# Patient Record
Sex: Male | Born: 1975 | Race: White | Hispanic: No | Marital: Single | Smoking: Never smoker
Health system: Southern US, Community
[De-identification: ages and names within clinical notes are randomized; demographics above are authoritative.]

## PROBLEM LIST (undated history)

## (undated) DIAGNOSIS — N200 Calculus of kidney: Secondary | ICD-10-CM

## (undated) DIAGNOSIS — G809 Cerebral palsy, unspecified: Secondary | ICD-10-CM

## (undated) DIAGNOSIS — D649 Anemia, unspecified: Secondary | ICD-10-CM

## (undated) DIAGNOSIS — K264 Chronic or unspecified duodenal ulcer with hemorrhage: Secondary | ICD-10-CM

## (undated) DIAGNOSIS — K219 Gastro-esophageal reflux disease without esophagitis: Secondary | ICD-10-CM

## (undated) HISTORY — DX: Chronic or unspecified duodenal ulcer with hemorrhage: K26.4

## (undated) HISTORY — DX: Cerebral palsy, unspecified: G80.9

## (undated) HISTORY — DX: Anemia, unspecified: D64.9

---

## 2007-11-07 ENCOUNTER — Ambulatory Visit: Payer: Self-pay | Admitting: Internal Medicine

## 2007-11-21 ENCOUNTER — Emergency Department: Payer: Self-pay | Admitting: Emergency Medicine

## 2007-12-07 HISTORY — PX: COLONOSCOPY: SHX174

## 2008-01-08 ENCOUNTER — Ambulatory Visit: Payer: Self-pay | Admitting: Unknown Physician Specialty

## 2008-05-21 ENCOUNTER — Emergency Department: Payer: Self-pay | Admitting: Emergency Medicine

## 2009-05-09 IMAGING — CR DG KNEE 1-2V*L*
1 series · 2 of 2 positions shown · non-contrast
Comparison: none

REASON FOR EXAM: pain
COMMENTS:

PROCEDURE:     DXR - DXR KNEE LEFT AP AND LATERAL  - May 21, 2008  [DATE]
RESULT:     No fracture, dislocation or other acute bony abnormality is
identified. The knee joint space is well maintained. The patella is intact.

[Series 1: view not recorded · 0.17mm/px · 2 of 2 slices shown]
[im 1/2]
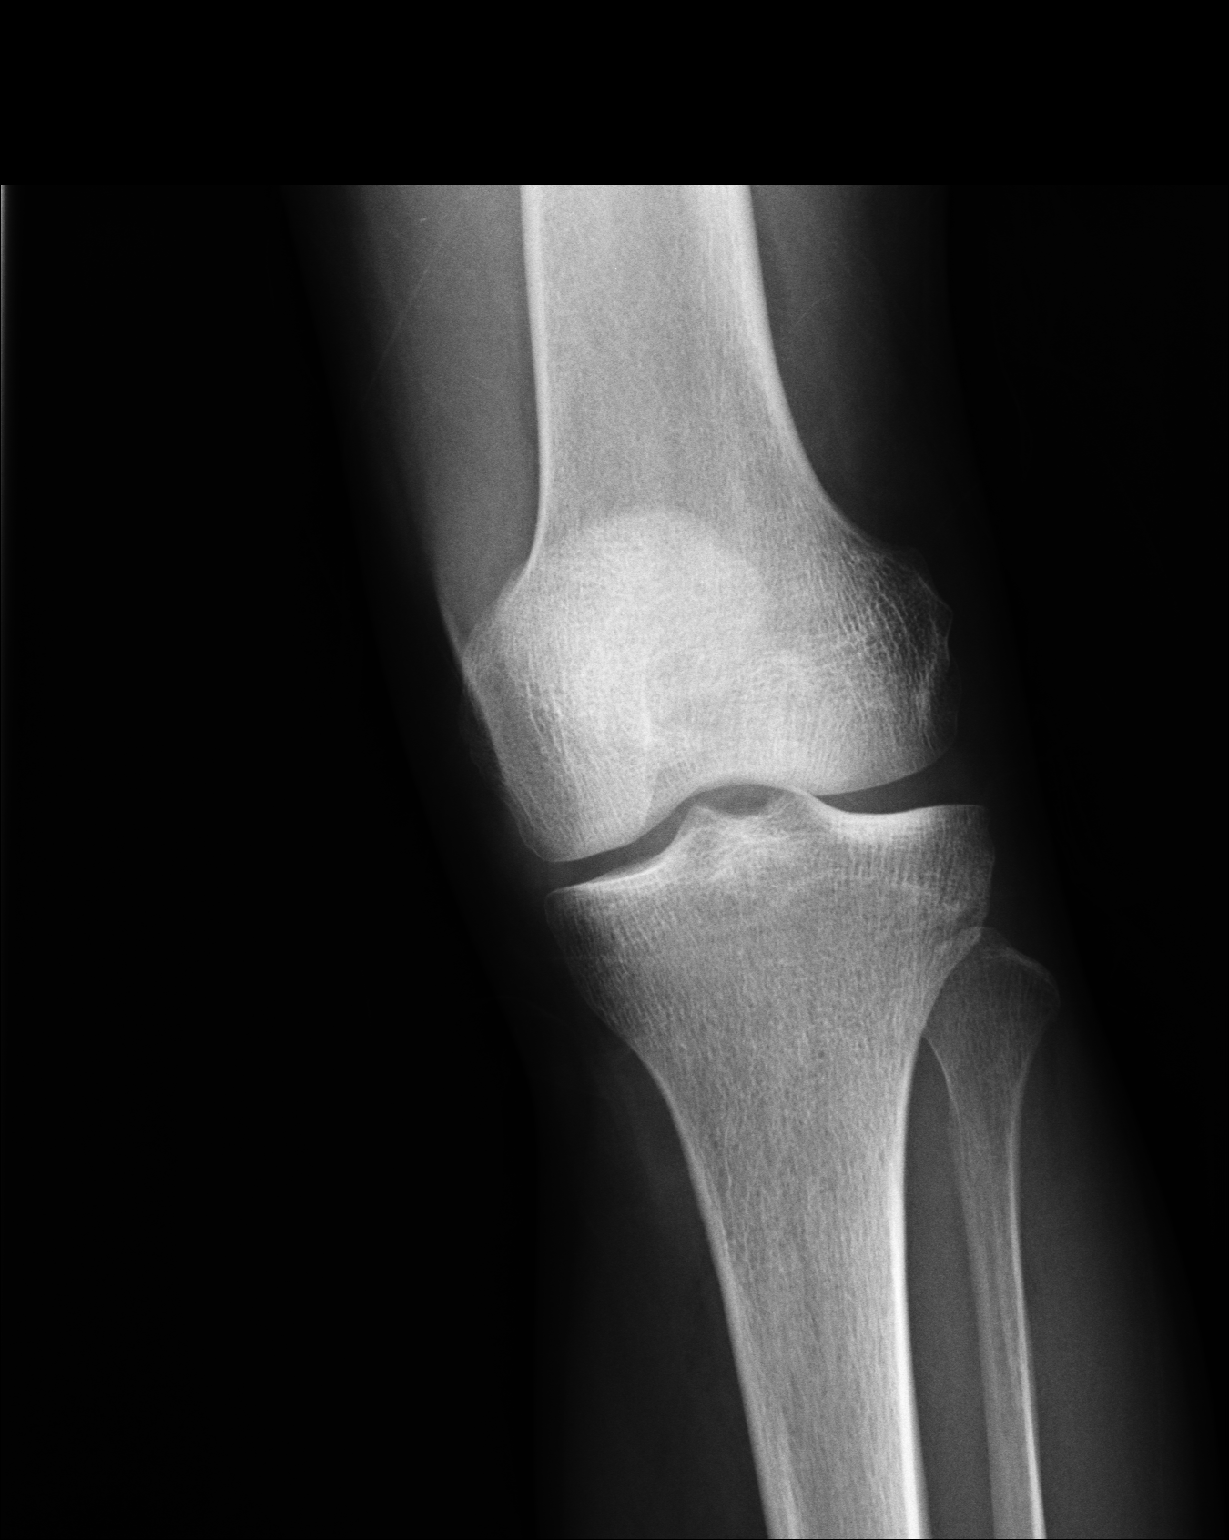
[im 2/2]
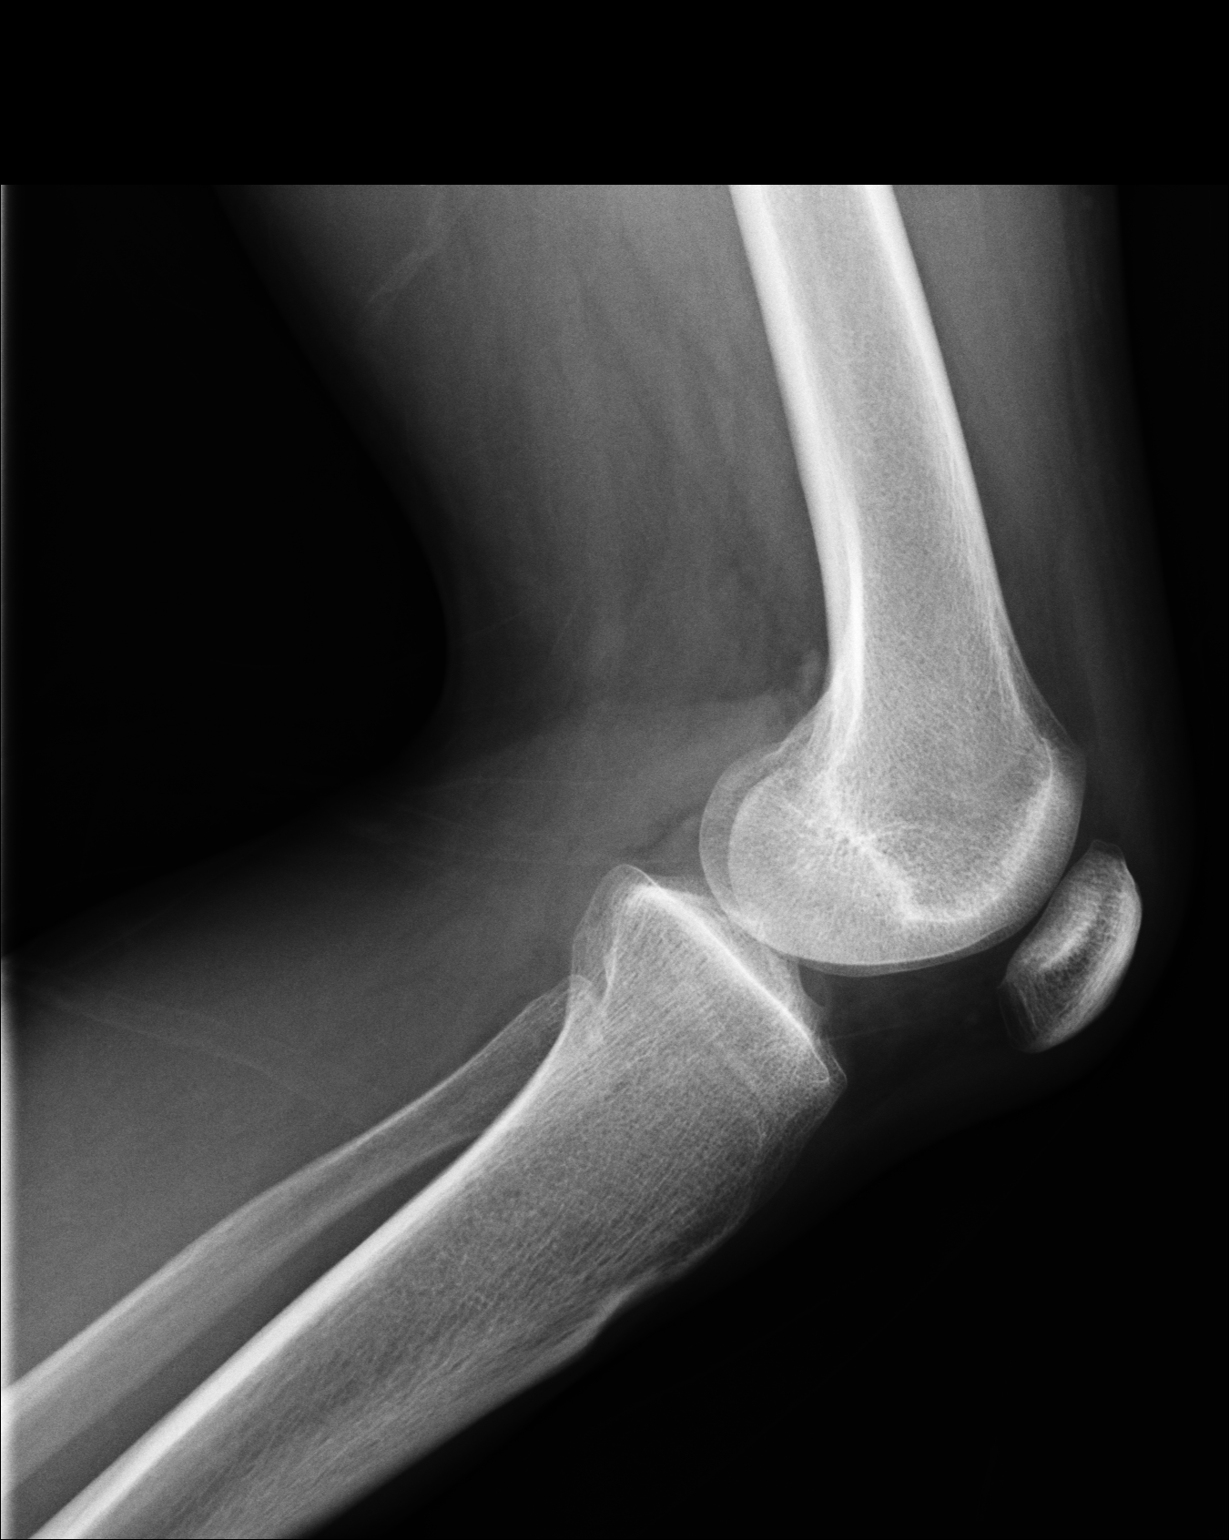

[2 of 2 positions shown; findings below may reference images not displayed]

IMPRESSION: 1.     No acute changes are identified.

## 2009-05-09 IMAGING — CR PELVIS - 1-2 VIEW
1 series · 1 of 1 positions shown · non-contrast
Comparison: none

REASON FOR EXAM: hip pain
COMMENTS:

PROCEDURE:     DXR - DXR PELVIS AP ONLY  - May 21, 2008  [DATE]
RESULT:     No fracture or other acute bony abnormality is identified. The
hip joint spaces are bilaterally symmetrical. The sacroiliac joints are
normal in appearance.

[view not recorded]
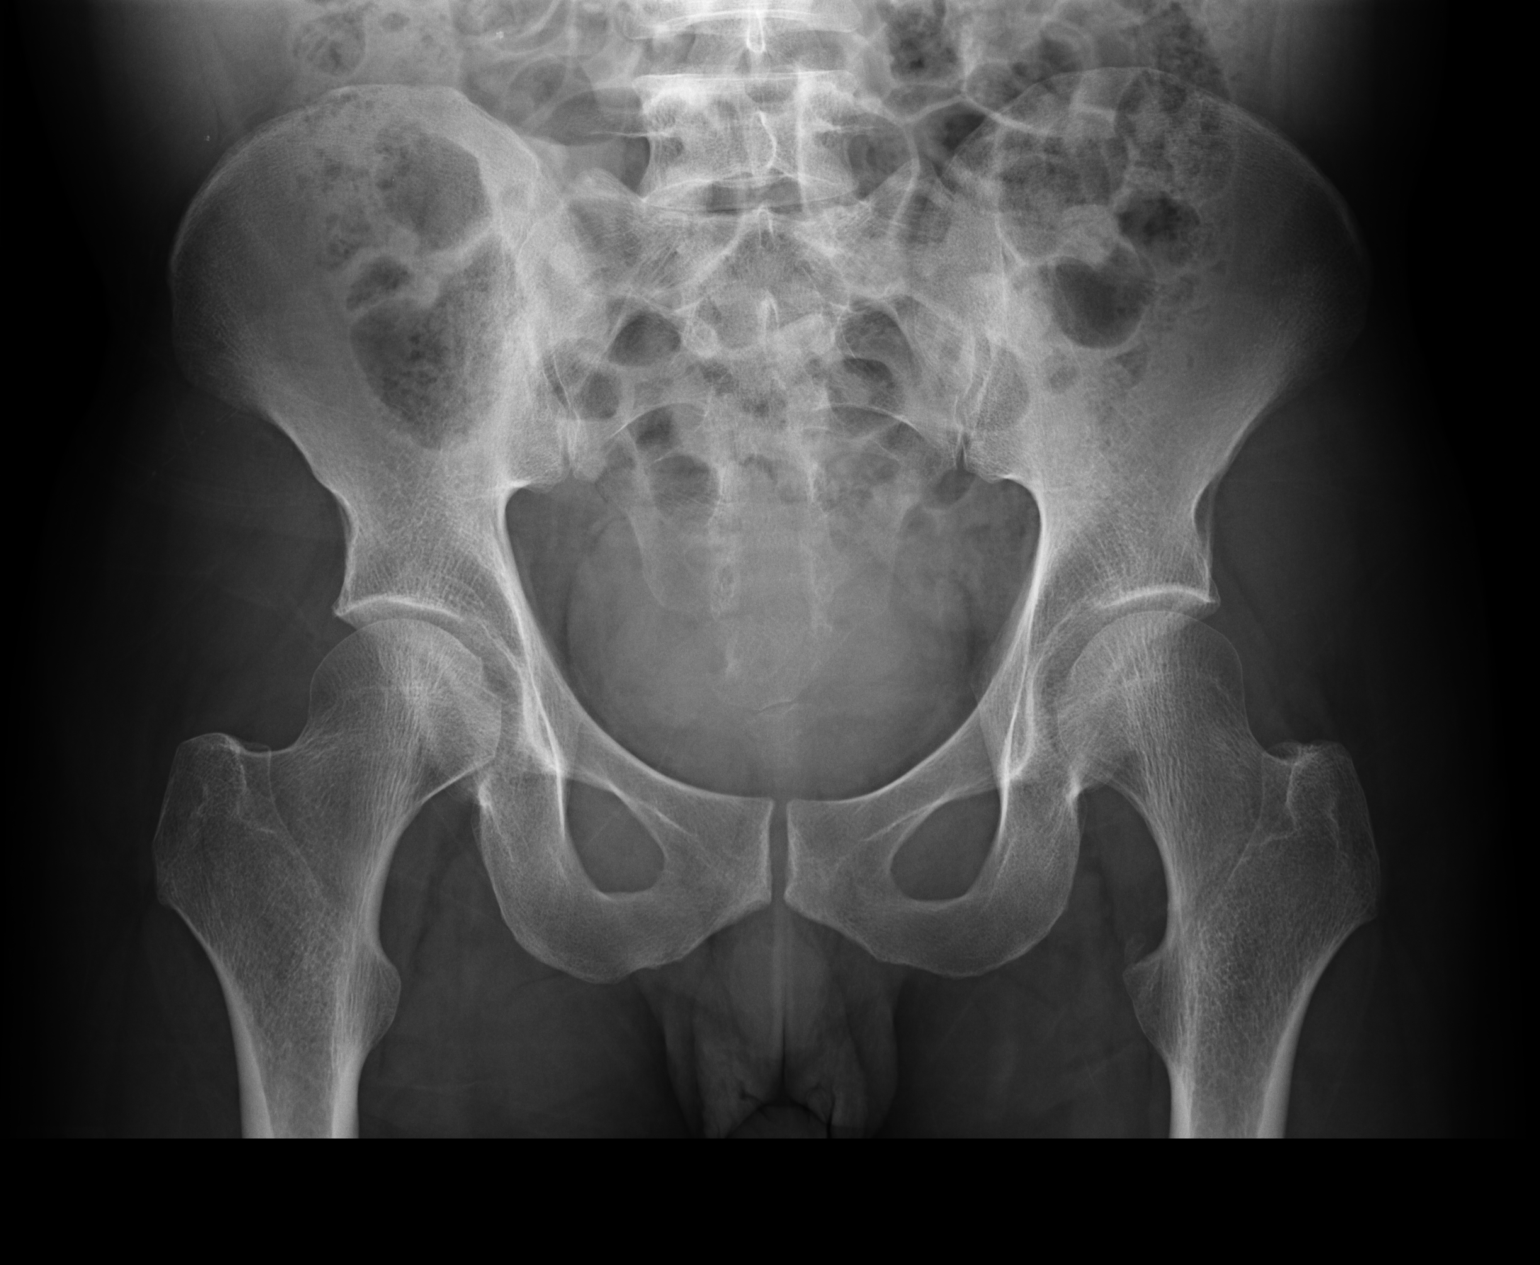

[1 of 1 positions shown; findings below may reference images not displayed]

IMPRESSION: 1.     No significant abnormalities are noted.

## 2009-12-06 HISTORY — PX: HIP FRACTURE SURGERY: SHX118

## 2010-08-13 ENCOUNTER — Inpatient Hospital Stay: Payer: Self-pay | Admitting: Specialist

## 2011-04-15 ENCOUNTER — Ambulatory Visit: Payer: Self-pay | Admitting: Unknown Physician Specialty

## 2011-12-07 HISTORY — PX: ESOPHAGOGASTRODUODENOSCOPY: SHX1529

## 2012-05-12 ENCOUNTER — Ambulatory Visit: Payer: Self-pay | Admitting: Unknown Physician Specialty

## 2012-05-16 LAB — PATHOLOGY REPORT

## 2012-07-19 ENCOUNTER — Ambulatory Visit: Payer: Self-pay | Admitting: Surgery

## 2012-08-15 ENCOUNTER — Ambulatory Visit: Payer: Self-pay | Admitting: Surgery

## 2012-08-15 LAB — COMPREHENSIVE METABOLIC PANEL
BUN: 14 mg/dL (ref 7–18)
Bilirubin,Total: 0.4 mg/dL (ref 0.2–1.0)
Calcium, Total: 9.2 mg/dL (ref 8.5–10.1)
Chloride: 107 mmol/L (ref 98–107)
Creatinine: 0.73 mg/dL (ref 0.60–1.30)
EGFR (African American): >60
Potassium: 3.8 mmol/L (ref 3.5–5.1)
SGPT (ALT): 23 U/L (ref 12–78)
Total Protein: 7.4 g/dL (ref 6.4–8.2)

## 2012-08-17 ENCOUNTER — Ambulatory Visit: Payer: Self-pay | Admitting: Surgery

## 2012-08-17 LAB — CBC WITH DIFFERENTIAL/PLATELET
Basophil #: 0 x10 3/mm 3
Basophil %: 0.9 %
Eosinophil #: 0.2 x10 3/mm 3
Eosinophil %: 4.3 %
HCT: 40 %
HGB: 13.9 g/dL
Lymphocyte %: 21.4 %
Lymphs Abs: 0.9 x10 3/mm 3 — ABNORMAL LOW
MCH: 30 pg
MCHC: 34.7 g/dL
MCV: 86 fL
Monocyte #: 0.4 "x10 3/mm "
Monocyte %: 8.3 %
Neutrophil #: 2.8 x10 3/mm 3
Neutrophil %: 65.1 %
Platelet: 196 x10 3/mm 3
RBC: 4.63 x10 6/mm 3
RDW: 14.5 %
WBC: 4.3 x10 3/mm 3

## 2012-08-22 ENCOUNTER — Ambulatory Visit: Payer: Self-pay | Admitting: Surgery

## 2012-08-23 LAB — PATHOLOGY REPORT

## 2013-07-07 IMAGING — NM NUCLEAR MEDICINE GASTRIC EMPTYING STUDY
6 series · 20 of 20 positions shown · non-contrast
Comparison: none

REASON FOR EXAM: reflux  hiatal hernia
COMMENTS:

[Series 1000: gastic statics 4 hour (results) · 3.90mm/px · 5 acquisitions, 10 frames shown]
[im 1/5]
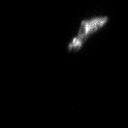
[im 1/5]
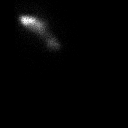
[im 2/5]
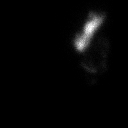
[im 2/5]
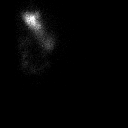
[im 3/5]
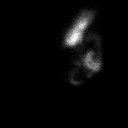
[im 3/5]
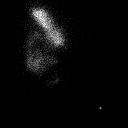
[im 4/5]
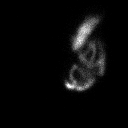
[im 4/5]
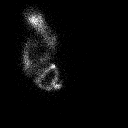
[im 5/5]
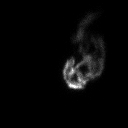
[im 5/5]
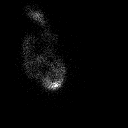

[Series 1000: gastric statics 2 hour · 3.90mm/px · 2 of 2 frames shown]
[frame 1/2]
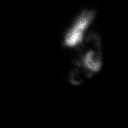
[frame 2/2]
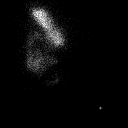

[Series 1000: gastric statics · 3.90mm/px · 2 of 2 frames shown]
[frame 1/2]
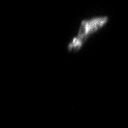
[frame 2/2]
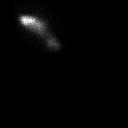

[Series 1000: gastric statics 1 hour · 3.90mm/px · 2 of 2 frames shown]
[frame 1/2]
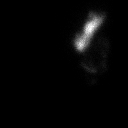
[frame 2/2]
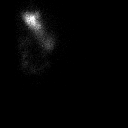

[Series 1000: gastic statics 4 hour · 3.90mm/px · 2 of 2 frames shown]
[frame 1/2]
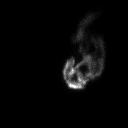
[frame 2/2]
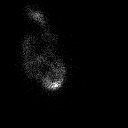

[Series 1000: gastric statics 3 hour · 3.90mm/px · 2 of 2 frames shown]
[frame 1/2]
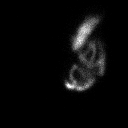
[frame 2/2]
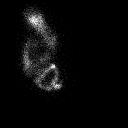

[20 of 20 positions shown; findings below may reference images not displayed]

PROCEDURE:     KNM - KNM GASTRIC EMPTYING STUDY  - July 19, 2012  [DATE]

RESULT:     The patient ingested the standard meal consisting of 4 ounces
water or ounces of egg whites in 2 pieces of toast with one package of
Locklear. Anterior and posterior images were obtained incrementally 4 total of
4 hours. Images demonstrate normal intragastric transit and excellent
emptying of activity from the stomach. At the end of 4 hours over 85%
appears to empty from the stomach.
IMPRESSION: Normal-appearing gastric emptying study.

[REDACTED]

## 2014-02-28 ENCOUNTER — Encounter: Payer: Self-pay | Admitting: Psychiatry

## 2014-03-06 ENCOUNTER — Encounter: Payer: Self-pay | Admitting: Psychiatry

## 2014-04-05 ENCOUNTER — Encounter: Payer: Self-pay | Admitting: Psychiatry

## 2014-06-11 ENCOUNTER — Ambulatory Visit: Payer: Self-pay | Admitting: Psychiatry

## 2014-09-03 DIAGNOSIS — G930 Cerebral cysts: Secondary | ICD-10-CM | POA: Insufficient documentation

## 2014-09-03 DIAGNOSIS — Z8719 Personal history of other diseases of the digestive system: Secondary | ICD-10-CM | POA: Insufficient documentation

## 2014-09-03 DIAGNOSIS — G809 Cerebral palsy, unspecified: Secondary | ICD-10-CM | POA: Insufficient documentation

## 2014-09-03 DIAGNOSIS — Z87442 Personal history of urinary calculi: Secondary | ICD-10-CM | POA: Insufficient documentation

## 2014-09-03 DIAGNOSIS — K219 Gastro-esophageal reflux disease without esophagitis: Secondary | ICD-10-CM | POA: Insufficient documentation

## 2015-02-10 DIAGNOSIS — R600 Localized edema: Secondary | ICD-10-CM | POA: Insufficient documentation

## 2015-03-25 NOTE — Op Note (Signed)
PATIENT NAME:  Alexander Sweeney, [name] MR#:  [id number] DATE OF BIRTH:  1976-05-28  DATE OF PROCEDURE:  08/22/2012  PREOPERATIVE DIAGNOSIS: Severe reflux.  POSTOPERATIVE DIAGNOSIS: Severe reflux.  OPERATION PERFORMED: Microlaryngoscopy with biopsy of larynx.   SURGEON: Davina Pokehapman T. Murphy Duzan, M.D.   OPERATIVE FINDINGS: Normal appearing glottis. He did have some significant erythema over the arytenoids and the postcricoid upper esophagus.   DESCRIPTION OF PROCEDURE: Worth was identified in the holding area and taken to the operating room and placed in the supine position. After general endotracheal anesthesia, the table was turned 90 degrees. A tooth guard was then placed. A Dedo laryngoscope was introduced into the airway. Examination of the larynx showed the vocal folds to be normal. The epiglottis and preepiglottic space appeared normal. The vallecula was normal. Examination of the piriforms did show significant erythema particularly in the postcricoid area and over both arytenoids. The laryngoscope was then positioned and suspended with the endotracheal tube anteriorly, giving  excellent visualization of the posterior aspect of the larynx. Cottonoid pledgets with phenylephrine and lidocaine were used to apply to the retinoid areas bilaterally. After five minutes these pledgets were removed. The small Shapshay microlaryngeal instruments were then brought onto the field. The straight cups were then used to take biopsies of the right arytenoid, the left arytenoid, and the postcricoid mucosa of the posterior hypopharyngeal wall. These biopsies were sent for permanent section. Cottonoid pledgets were then replaced over these areas again for hemostasis. After five minutes they were removed. Reexamination of the area showed no evidence of significant bleeding. The patient was then returned to anesthesia where Dr. Renda RollsWilton Smith was preparing for a Nissen fundoplication.   CULTURES: None.   SPECIMENS: Right and left  arytenoids, postcricoid.     ESTIMATED BLOOD LOSS: Less than 5 mL.   ____________________________ Davina Pokehapman T. Shamecka Hocutt, MD [id number] D: 08/22/2012 09:54:29 ET T: 08/22/2012 11:20:34 ET JOB#: [id number]  cc: Davina Pokehapman T. Candela Krul, MD, <Dictator> Davina PokeHAPMAN T Zyaira Vejar MD ELECTRONICALLY SIGNED 08/25/2012 7:47

## 2015-03-25 NOTE — Op Note (Signed)
PATIENT NAME:  Alexander Sweeney, Alexander Sweeney MR#:  [id number] DATE OF BIRTH:  01/13/1976  DATE OF PROCEDURE:  08/22/2012  PREOPERATIVE DIAGNOSIS: Chronic gastroesophageal reflux.   POSTOPERATIVE DIAGNOSIS: Chronic gastroesophageal reflux.   PROCEDURE: Laparoscopic Nissen fundoplication.  SURGEON: Adella HareJ. Wilton Smith, MD    ANESTHESIA: General.   INDICATIONS: This 39 year old male has a history of chronic gastroesophageal reflux, history of heartburn. He had laryngoscopy with findings of inflammation. He has been on treatment with Protonix but not making satisfactory progress with treatment. Manometry demonstrated some minimal diminution and esophageal body peristalsis. Gastric emptying was normal. Endoscopy was with evidence of reflux and surgery was recommended for definitive treatment.   DESCRIPTION OF PROCEDURE: The patient was placed on the operating table in the supine position under general endotracheal anesthesia. Dr. Jenne CampusMcQueen did laryngoscopy and biopsy and he will dictate a separate note.   After satisfactory completion of the laryngoscopy, the table was positioned for the laparoscopy. The abdomen was clipped and prepared with ChloraPrep and draped in a sterile manner.   The first incision was made just about 2 inches cephalad to the umbilicus some 12 mm in length, carried down through subcutaneous tissues to the deep fascia which was grasped with laryngeal hook and elevated. Veress needle was inserted, aspirated, and irrigated with a saline solution. Next, the peritoneal cavity was insufflated with carbon dioxide. The Veress needle was removed. The 10 mm cannula was inserted. The 10 mm 30 degree laparoscope was inserted to view the peritoneal cavity. The liver appeared normal. There was some gaseous distention of the stomach and I had the anesthetist insert an orogastric tube to decompress the stomach. Another incision was made just below the xiphoid process to insert an 11 mm cannula. Another incision was  made in the left upper quadrant at the costal margin at the anterior axillary line to insert an 11 mm cannula. Another incision was made at the right costal margin at the midclavicular line to insert an 11 mm cannula and another midway between that point and the camera site and a sixth port was placed in the right upper quadrant at the anterior axillary line for a total of six ports.   With the patient in the reverse Trendelenburg position, the fan retractor was introduced through the subxiphoid port to retract the left lobe of the liver anterior and to the right. This was held in place with the Bookwalter mechanical arm. A Babcock clamp was introduced to apply traction to the stomach. The diaphragmatic hiatus was identified. No hiatus hernia was seen as the stomach was within the abdomen. Next, the gastrohepatic ligament was incised with the Harmonic scalpel and this incision was carried over the anterior aspect of the esophagus incising the visceral peritoneum and dissection was carried down to expose the left crus of the diaphragm. Next, further dissection was carried out with Kitner's between the right crus of the diaphragm and the esophagus. There was no hiatus hernia found. Kitner's were used to dissect posterior to the esophagus to create a window dissecting from the patient's right to left. Babcock clamp was advanced from the right to left through the port in the anterior axillary line and the window posterior to the esophagus was triangulated and was approximately 3 cm. The left and right crus of the diaphragm were identified and seeing no hiatus hernia. Next, the fundus was found to be satisfactorily floppy and a portion of fundus was grasped with a Babcock clamp and carried posterior to the esophagus from left  to right. Another portion of the fundus, which was more anterior, was brought adjacent to it. The fundoplication was carried out with 0 Surgidac sutures placing one simple suture and two  figure-of-eight sutures with the first suture 2 cm from the last. Another suture was used to anchor the anterior aspect of the fundus to the right crus of the diaphragm to prevent traction on the esophagus. The fundoplication looked good. Hemostasis was intact. The laparoscopic instruments were removed. The wounds were infiltrated with 0.5% Sensorcaine with epinephrine. There was no significant bleeding from the port sites other than several small bleeding points in the subcutaneous tissues which were cauterized. The skin incisions were closed with interrupted 4-0 Monocryl sutures and Dermabond. The patient tolerated surgery satisfactorily and was then prepared for transfer to the recovery room.   ____________________________ Shela Commons. Renda Rolls, MD [email or web address] D: 08/22/2012 11:44:01 ET T: 08/22/2012 13:25:52 ET JOB#: [id number]  cc: Adella Hare, MD, <Dictator> Adella Hare MD ELECTRONICALLY SIGNED 08/24/2012 18:23

## 2017-02-01 DIAGNOSIS — D649 Anemia, unspecified: Secondary | ICD-10-CM | POA: Insufficient documentation

## 2020-03-26 DIAGNOSIS — I878 Other specified disorders of veins: Secondary | ICD-10-CM | POA: Insufficient documentation

## 2020-04-10 ENCOUNTER — Encounter (INDEPENDENT_AMBULATORY_CARE_PROVIDER_SITE_OTHER): Payer: Self-pay | Admitting: Vascular Surgery

## 2020-04-21 ENCOUNTER — Encounter (INDEPENDENT_AMBULATORY_CARE_PROVIDER_SITE_OTHER): Payer: Medicare PPO | Admitting: Vascular Surgery

## 2020-05-29 ENCOUNTER — Ambulatory Visit (INDEPENDENT_AMBULATORY_CARE_PROVIDER_SITE_OTHER): Payer: Medicare PPO | Admitting: Vascular Surgery

## 2020-05-29 ENCOUNTER — Other Ambulatory Visit: Payer: Self-pay

## 2020-05-29 ENCOUNTER — Encounter (INDEPENDENT_AMBULATORY_CARE_PROVIDER_SITE_OTHER): Payer: Self-pay | Admitting: Vascular Surgery

## 2020-05-29 VITALS — BP 124/83 | HR 91 | Resp 18 | Ht 71.0 in | Wt 220.0 lb

## 2020-05-29 DIAGNOSIS — R600 Localized edema: Secondary | ICD-10-CM

## 2020-05-29 DIAGNOSIS — I878 Other specified disorders of veins: Secondary | ICD-10-CM | POA: Diagnosis not present

## 2020-05-29 DIAGNOSIS — K219 Gastro-esophageal reflux disease without esophagitis: Secondary | ICD-10-CM

## 2020-05-29 NOTE — Progress Notes (Signed)
MRN : [id number]  Alexander Sweeney is a 44 y.o. (07/31/1976) male who presents with chief complaint of  Chief Complaint  Patient presents with  . New Patient (Initial Visit)    Venous stasis  .  History of Present Illness:   Patient is seen for evaluation of leg pain and leg swelling. The patient first noticed the swelling remotely. The swelling is associated with pain and discoloration. The pain and swelling worsens with prolonged dependency and improves with elevation. The pain is unrelated to activity.  The patient notes that in the morning the legs are significantly improved but they steadily worsened throughout the course of the day. The patient also notes a steady worsening of the discoloration in the ankle and shin area.   The patient denies claudication symptoms.  The patient denies symptoms consistent with rest pain.  The patient denies and extensive history of DJD and LS spine disease.  The patient has no had any past angiography, interventions or vascular surgery.  Elevation makes the leg symptoms better, dependency makes them much worse. There is no history of ulcerations. The patient denies any recent changes in medications.  The patient has not been wearing graduated compression.  The patient denies a history of DVT or PE. There is no prior history of phlebitis. There is no history of primary lymphedema.  No history of malignancies. No history of trauma or groin or pelvic surgery. There is no history of radiation treatment to the groin or pelvis  The patient denies amaurosis fugax or recent TIA symptoms. There are no recent neurological changes noted. The patient denies recent episodes of angina or shortness of breath  No outpatient medications have been marked as taking for the 05/29/20 encounter (Office Visit) with Gilda Crease, Latina Craver, MD.    No past medical history on file.    Social History Social History   Tobacco Use  . Smoking status: Never Smoker  .  Smokeless tobacco: Never Used  Substance Use Topics  . Alcohol use: Never  . Drug use: Never    Family History No family history on file.  No Known Allergies   REVIEW OF SYSTEMS (Negative unless checked)  Constitutional: [] Weight loss  [] Fever  [] Chills Cardiac: [] Chest pain   [] Chest pressure   [] Palpitations   [] Shortness of breath when laying flat   [] Shortness of breath with exertion. Vascular:  [] Pain in legs with walking   [x] Pain in legs at rest  [] History of DVT   [] Phlebitis   [x] Swelling in legs   [] Varicose veins   [] Non-healing ulcers Pulmonary:   [] Uses home oxygen   [] Productive cough   [] Hemoptysis   [] Wheeze  [] COPD   [] Asthma Neurologic:  [] Dizziness   [] Seizures   [] History of stroke   [] History of TIA  [] Aphasia   [] Vissual changes   [] Weakness or numbness in arm   [] Weakness or numbness in leg Musculoskeletal:   [] Joint swelling   [] Joint pain   [] Low back pain Hematologic:  [] Easy bruising  [] Easy bleeding   [] Hypercoagulable state   [] Anemic Gastrointestinal:  [] Diarrhea   [] Vomiting  [] Gastroesophageal reflux/heartburn   [] Difficulty swallowing. Genitourinary:  [] Chronic kidney disease   [] Difficult urination  [] Frequent urination   [] Blood in urine Skin:  [] Rashes   [] Ulcers  Psychological:  [] History of anxiety   []  History of major depression.  Physical Examination  Vitals:   05/29/20 1352  BP: 124/83  Pulse: 91  Resp: 18  Weight: 220 lb (99.8 kg)  Height: 5\' 11"  (1.803 m)   Body mass index is 30.68 kg/m. Gen: WD/WN, NAD Head: [place]/AT, No temporalis wasting.  Ear/Nose/Throat: Hearing grossly intact, nares w/o erythema or drainage Eyes: PER, EOMI, sclera nonicteric.  Neck: Supple, no large masses.   Pulmonary:  Good air movement, no audible wheezing bilaterally, no use of accessory muscles.  Cardiac: RRR, no JVD Vascular: scattered varicosities present bilaterally.  Mild venous stasis changes to the legs bilaterally.  3+ soft pitting edema Vessel  Right Left  Radial Palpable Palpable  PT Palpable Palpable  DP Palpable Palpable  Gastrointestinal: Non-distended. No guarding/no peritoneal signs.  Musculoskeletal: M/S 5/5 throughout.  No deformity or atrophy.  Neurologic: CN 2-12 intact. Symmetrical.  Speech is fluent. Motor exam as listed above. Psychiatric: Judgment intact, Mood & affect appropriate for pt's clinical situation. Dermatologic: No rashes or ulcers noted.  No changes consistent with cellulitis. Lymph : No lichenification or skin changes of chronic lymphedema.  CBC Lab Results  Component Value Date   WBC 4.3 08/17/2012   HGB 13.9 08/17/2012   HCT 40.0 08/17/2012   MCV 86 08/17/2012   PLT 183 08/24/2012    BMET    Component Value Date/Time   NA 141 08/15/2012 1352   K 3.8 08/15/2012 1352   CL 107 08/15/2012 1352   CO2 26 08/15/2012 1352   GLUCOSE 85 08/15/2012 1352   BUN 14 08/15/2012 1352   CREATININE 0.73 08/15/2012 1352   CALCIUM 9.2 08/15/2012 1352   GFRNONAA >60 08/15/2012 1352   GFRAA >60 08/15/2012 1352   CrCl cannot be calculated (Patient's most recent lab result is older than the maximum 21 days allowed.).  COAG No results found for: INR, PROTIME  Radiology No results found.   Assessment/Plan 1. Venous stasis No surgery or intervention at this point in time.  I have reviewed my discussion with the patient regarding venous insufficiency and why it causes symptoms. I have discussed with the patient the chronic skin changes that accompany venous insufficiency and the long term sequela such as ulceration. Patient will contnue wearing graduated compression stockings on a daily basis, as this has provided excellent control of his edema. The patient will put the stockings on first thing in the morning and removing them in the evening. The patient is reminded not to sleep in the stockings.  In addition, behavioral modification including elevation during the day will be initiated. Exercise is  strongly encouraged.  Given the patient's good control and lack of any problems regarding the venous insufficiency and lymphedema a lymph pump in not need at this time.  The patient will follow up with me PRN should anything change.  The patient voices agreement with this plan.   2. Pedal edema No surgery or intervention at this point in time.  I have reviewed my discussion with the patient regarding venous insufficiency and why it causes symptoms. I have discussed with the patient the chronic skin changes that accompany venous insufficiency and the long term sequela such as ulceration. Patient will contnue wearing graduated compression stockings on a daily basis, as this has provided excellent control of his edema. The patient will put the stockings on first thing in the morning and removing them in the evening. The patient is reminded not to sleep in the stockings.  In addition, behavioral modification including elevation during the day will be initiated. Exercise is strongly encouraged.  Given the patient's good control and lack of any problems regarding the venous insufficiency and lymphedema a  lymph pump in not need at this time.  The patient will follow up with me PRN should anything change.  The patient voices agreement with this plan.   3. Gastroesophageal reflux disease without esophagitis Continue PPI as already ordered, this medication has been reviewed and there are no changes at this time.  Avoidence of caffeine and alcohol  Moderate elevation of the head of the bed     Hortencia Pilar, MD  05/29/2020 1:56 PM

## 2020-06-09 ENCOUNTER — Encounter (INDEPENDENT_AMBULATORY_CARE_PROVIDER_SITE_OTHER): Payer: Self-pay | Admitting: Vascular Surgery

## 2023-03-08 ENCOUNTER — Encounter: Payer: Self-pay | Admitting: Internal Medicine

## 2023-03-09 ENCOUNTER — Ambulatory Visit: Payer: Medicare PPO | Admitting: Anesthesiology

## 2023-03-09 ENCOUNTER — Ambulatory Visit
Admission: RE | Admit: 2023-03-09 | Discharge: 2023-03-09 | Disposition: A | Discharge status: Home / Self Care | Payer: Medicare PPO | Attending: Internal Medicine | Admitting: Internal Medicine

## 2023-03-09 ENCOUNTER — Encounter
Admission: RE | Disposition: A | Discharge status: Home / Self Care | Payer: Self-pay | Source: Home / Self Care | Attending: Internal Medicine

## 2023-03-09 DIAGNOSIS — Z86010 Personal history of colonic polyps: Secondary | ICD-10-CM | POA: Insufficient documentation

## 2023-03-09 DIAGNOSIS — Z8711 Personal history of peptic ulcer disease: Secondary | ICD-10-CM | POA: Insufficient documentation

## 2023-03-09 DIAGNOSIS — K5730 Diverticulosis of large intestine without perforation or abscess without bleeding: Secondary | ICD-10-CM | POA: Insufficient documentation

## 2023-03-09 DIAGNOSIS — K640 First degree hemorrhoids: Secondary | ICD-10-CM | POA: Diagnosis not present

## 2023-03-09 DIAGNOSIS — Z87442 Personal history of urinary calculi: Secondary | ICD-10-CM | POA: Insufficient documentation

## 2023-03-09 DIAGNOSIS — K219 Gastro-esophageal reflux disease without esophagitis: Secondary | ICD-10-CM | POA: Insufficient documentation

## 2023-03-09 DIAGNOSIS — Z1211 Encounter for screening for malignant neoplasm of colon: Secondary | ICD-10-CM | POA: Diagnosis present

## 2023-03-09 DIAGNOSIS — G809 Cerebral palsy, unspecified: Secondary | ICD-10-CM | POA: Insufficient documentation

## 2023-03-09 HISTORY — DX: Gastro-esophageal reflux disease without esophagitis: K21.9

## 2023-03-09 HISTORY — DX: Calculus of kidney: N20.0

## 2023-03-09 HISTORY — PX: COLONOSCOPY WITH PROPOFOL: SHX5780

## 2023-03-09 SURGERY — COLONOSCOPY WITH PROPOFOL
Anesthesia: General

## 2023-03-09 MED ORDER — PROPOFOL 500 MG/50ML IV EMUL
INTRAVENOUS | Status: DC | PRN
  Administered 2023-03-09: 75 ug/kg/min via INTRAVENOUS

## 2023-03-09 MED ORDER — LIDOCAINE HCL (CARDIAC) PF 100 MG/5ML IV SOSY
PREFILLED_SYRINGE | INTRAVENOUS | Status: DC | PRN
  Administered 2023-03-09: 50 mg via INTRAVENOUS

## 2023-03-09 MED ORDER — LIDOCAINE HCL (PF) 2 % IJ SOLN
INTRAMUSCULAR | Status: AC
  Filled 2023-03-09: qty 5

## 2023-03-09 MED ORDER — PROPOFOL 10 MG/ML IV BOLUS
INTRAVENOUS | Status: AC
  Filled 2023-03-09: qty 40

## 2023-03-09 MED ORDER — SODIUM CHLORIDE 0.9 % IV SOLN
INTRAVENOUS | Status: DC
  Administered 2023-03-09: 20 mL/h via INTRAVENOUS

## 2023-03-09 MED ORDER — PROPOFOL 10 MG/ML IV BOLUS
INTRAVENOUS | Status: DC | PRN
  Administered 2023-03-09: 20 mg via INTRAVENOUS
  Administered 2023-03-09: 100 mg via INTRAVENOUS
  Administered 2023-03-09: 20 mg via INTRAVENOUS

## 2023-03-09 NOTE — Op Note (Signed)
[facility] Gastroenterology Patient Name: Alexander Sweeney Procedure Date: 03/09/2023 11:52 AM MRN: [id number] Account #: [id number] Date of Birth: 01-22-1976 Admit Type: Outpatient Age: 47 Room: [facility] ENDO ROOM 2 Gender: Male Note Status: Finalized Instrument Name: Jasper Riling [id number] Procedure:             Colonoscopy Indications:           Surveillance: Personal history of adenomatous polyps                         on last colonoscopy > 5 years ago Providers:             Lorie Apley K. Sharece Fleischhacker MD, MD Medicines:             Propofol per Anesthesia Complications:         No immediate complications. Procedure:             Pre-Anesthesia Assessment:                        - The risks and benefits of the procedure and the                         sedation options and risks were discussed with the                         patient. All questions were answered and informed                         consent was obtained.                        - Patient identification and proposed procedure were                         verified prior to the procedure by the nurse. The                         procedure was verified in the procedure room.                        - ASA Grade Assessment: III - A patient with severe                         systemic disease.                        - After reviewing the risks and benefits, the patient                         was deemed in satisfactory condition to undergo the                         procedure.                        After obtaining informed consent, the colonoscope was                         passed under direct vision. Throughout the procedure,  the patient's blood pressure, pulse, and oxygen                         saturations were monitored continuously. The                         Colonoscope was introduced through the anus and                         advanced to the the cecum, identified by appendiceal                          orifice and ileocecal valve. The colonoscopy was                         somewhat difficult due to a redundant colon and                         significant looping. Successful completion of the                         procedure was aided by applying abdominal pressure.                         The patient tolerated the procedure well. The quality                         of the bowel preparation was adequate. The ileocecal                         valve, appendiceal orifice, and rectum were                         photographed. Findings:      The perianal and digital rectal examinations were normal. Pertinent       negatives include normal sphincter tone and no palpable rectal lesions.      Non-bleeding internal hemorrhoids were found during retroflexion. The       hemorrhoids were Grade I (internal hemorrhoids that do not prolapse).      Multiple large-mouthed and small-mouthed diverticula were found in the       left colon. There was no evidence of diverticular bleeding.      The exam was otherwise without abnormality. Impression:            - Non-bleeding internal hemorrhoids.                        - Moderate diverticulosis in the left colon. There was                         no evidence of diverticular bleeding.                        - The examination was otherwise normal.                        - No specimens collected. Recommendation:        - Patient has a contact number available for  emergencies. The signs and symptoms of potential                         delayed complications were discussed with the patient.                         Return to normal activities tomorrow. Written                         discharge instructions were provided to the patient.                        - Resume previous diet.                        - Continue present medications.                        - Repeat colonoscopy in 10 years for screening                          purposes.                        - Return to GI office PRN.                        - The findings and recommendations were discussed with                         the patient. Procedure Code(s):     --- Professional ---                        [id number], Colorectal cancer screening; colonoscopy on                         individual at high risk Diagnosis Code(s):     --- Professional ---                        K57.30, Diverticulosis of large intestine without                         perforation or abscess without bleeding                        K64.0, First degree hemorrhoids                        Z86.010, Personal history of colonic polyps CPT copyright 2022 [employer]. All rights reserved. The codes documented in this report are preliminary and upon coder review may  be revised to meet current compliance requirements. Efrain Sella MD, MD 03/09/2023 12:34:38 PM This report has been signed electronically. Number of Addenda: 0 Note Initiated On: 03/09/2023 11:52 AM Scope Withdrawal Time: 0 hours 9 minutes 29 seconds  Total Procedure Duration: 0 hours 18 minutes 38 seconds  Estimated Blood Loss:  Estimated blood loss: none.      [facility]

## 2023-03-09 NOTE — Anesthesia Postprocedure Evaluation (Signed)
Anesthesia Post Note  Patient: Alexander Sweeney.  Procedure(s) Performed: COLONOSCOPY WITH PROPOFOL  Patient location during evaluation: Endoscopy Anesthesia Type: General Level of consciousness: awake and alert Pain management: pain level controlled Vital Signs Assessment: post-procedure vital signs reviewed and stable Respiratory status: spontaneous breathing, nonlabored ventilation, respiratory function stable and patient connected to nasal cannula oxygen Cardiovascular status: blood pressure returned to baseline and stable Postop Assessment: no apparent nausea or vomiting Anesthetic complications: no   No notable events documented.   Last Vitals:  Vitals:   03/09/23 1244 03/09/23 1254  BP: 129/80 125/81  Pulse: 89 88  Resp: 18 20  Temp:    SpO2: 98% 98%    Last Pain:  Vitals:   03/09/23 1254  TempSrc:   PainSc: 0-No pain                 Precious Haws Akaylah Lalley

## 2023-03-09 NOTE — Anesthesia Preprocedure Evaluation (Signed)
Anesthesia Evaluation  Patient identified by MRN, date of birth, ID band Patient awake    Reviewed: Allergy & Precautions, NPO status , Patient's Chart, lab work & pertinent test results  History of Anesthesia Complications Negative for: history of anesthetic complications  Airway Mallampati: III  TM Distance: >3 FB Neck ROM: full    Dental  (+) Chipped, Poor Dentition   Pulmonary neg pulmonary ROS, neg shortness of breath   Pulmonary exam normal        Cardiovascular Exercise Tolerance: Good negative cardio ROS Normal cardiovascular exam     Neuro/Psych  Neuromuscular disease  negative psych ROS   GI/Hepatic Neg liver ROS, PUD,GERD  Controlled,,  Endo/Other  negative endocrine ROS    Renal/GU Renal disease  negative genitourinary   Musculoskeletal   Abdominal   Peds  (+) Delivery details - Hematology negative hematology ROS (+)   Anesthesia Other Findings Past Medical History: No date: Anemia No date: Cerebral palsy No date: GERD (gastroesophageal reflux disease) No date: Renal stones 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  Past Surgical History: 2009: COLONOSCOPY; Bilateral 2013: ESOPHAGOGASTRODUODENOSCOPY 2011: HIP FRACTURE SURGERY; Left  BMI    Body Mass Index: 27.47 kg/m      Reproductive/Obstetrics negative OB ROS                             Anesthesia Physical Anesthesia Plan  ASA: 3  Anesthesia Plan: General   Post-op Pain Management:    Induction: Intravenous  PONV Risk Score and Plan: Propofol infusion and TIVA  Airway Management Planned: Natural Airway and Nasal Cannula  Additional Equipment:   Intra-op Plan:   Post-operative Plan:   Informed Consent: I have reviewed the patients History and Physical, chart, labs and discussed the procedure including the risks, benefits and alternatives for the proposed anesthesia with the patient or authorized  representative who has indicated his/her understanding and acceptance.     Dental Advisory Given  Plan Discussed with: Anesthesiologist, CRNA and Surgeon  Anesthesia Plan Comments: (Patient and mother consented for risks of anesthesia including but not limited to:  - adverse reactions to medications - risk of airway placement if required - damage to eyes, teeth, lips or other oral mucosa - nerve damage due to positioning  - sore throat or hoarseness - Damage to heart, brain, nerves, lungs, other parts of body or loss of life  They voiced understanding.)       Anesthesia Quick Evaluation

## 2023-03-09 NOTE — H&P (Signed)
Outpatient short stay form Pre-procedure 03/09/2023 11:01 AM Alexander Sweeney K. Alice Reichert, M.D.  Primary Physician: Harrel Lemon, M.D.  Reason for visit:  Personal history of colon polyps  History of present illness:   04/15/2011 Physician: Dr. Kennis Carina @ [facility] Polyps Removed: None, 5 yr rpt recommended d/t PHP and recall ltr mailed 2017 [place] - 11/06/2008.                           Patient presents for colonoscopy for a personal hx of colon polyps. The patient denies abdominal pain, abnormal weight loss or rectal bleeding.     No current facility-administered medications for this encounter. No current outpatient medications on file.  No medications prior to admission.     No Known Allergies   Past Medical History:  Diagnosis Date   Anemia    Cerebral palsy    GERD (gastroesophageal reflux disease)    Renal stones    Ulcer duodenal hemorrhage     Review of systems:  Otherwise negative.    Physical Exam  Gen: Alert, oriented. Appears stated age.  HEENT: [place]/AT. PERRLA. Lungs: CTA, no wheezes. CV: RR nl S1, S2. Abd: soft, benign, no masses. BS+ Ext: No edema. Pulses 2+    Planned procedures: Proceed with colonoscopy. The patient understands the nature of the planned procedure, indications, risks, alternatives and potential complications including but not limited to bleeding, infection, perforation, damage to internal organs and possible oversedation/side effects from anesthesia. The patient agrees and gives consent to proceed.  Please refer to procedure notes for findings, recommendations and patient disposition/instructions.     Alexander Sweeney K. Alice Reichert, M.D. Gastroenterology 03/09/2023  11:01 AM

## 2023-03-09 NOTE — Transfer of Care (Signed)
Immediate Anesthesia Transfer of Care Note  Patient: Alexander Sweeney.  Procedure(s) Performed: COLONOSCOPY WITH PROPOFOL  Patient Location: Endoscopy Unit  Anesthesia Type:General  Level of Consciousness: awake and alert   Airway & Oxygen Therapy: Patient Spontanous Breathing  Post-op Assessment: Report given to RN and Post -op Vital signs reviewed and stable  Post vital signs: Reviewed and stable  Last Vitals:  Vitals Value Taken Time  BP 109/65 03/09/23 1234  Temp 37 C 03/09/23 1234  Pulse 105 03/09/23 1234  Resp 20 03/09/23 1234  SpO2 99 % 03/09/23 1234    Last Pain:  Vitals:   03/09/23 1234  TempSrc: Temporal  PainSc: 0-No pain         Complications: No notable events documented.

## 2023-03-09 NOTE — Interval H&P Note (Signed)
History and Physical Interval Note:  03/09/2023 11:51 AM  Alexander Sweeney.  has presented today for surgery, with the diagnosis of history adenomatous polyps.  The various methods of treatment have been discussed with the patient and family. After consideration of risks, benefits and other options for treatment, the patient has consented to  Procedure(s): COLONOSCOPY WITH PROPOFOL (N/A) as a surgical intervention.  The patient's history has been reviewed, patient examined, no change in status, stable for surgery.  I have reviewed the patient's chart and labs.  Questions were answered to the patient's satisfaction.     [place], [place]

## 2023-03-10 ENCOUNTER — Encounter: Payer: Self-pay | Admitting: Internal Medicine

## 2023-04-05 ENCOUNTER — Other Ambulatory Visit (HOSPITAL_BASED_OUTPATIENT_CLINIC_OR_DEPARTMENT_OTHER): Payer: Self-pay

## 2023-04-05 DIAGNOSIS — R0683 Snoring: Secondary | ICD-10-CM

## 2023-04-05 DIAGNOSIS — R0681 Apnea, not elsewhere classified: Secondary | ICD-10-CM

## 2023-04-12 ENCOUNTER — Encounter: Payer: Self-pay | Admitting: Internal Medicine

## 2023-09-20 ENCOUNTER — Encounter: Payer: Medicare PPO | Attending: Physician Assistant | Admitting: Physician Assistant

## 2023-09-20 DIAGNOSIS — I87323 Chronic venous hypertension (idiopathic) with inflammation of bilateral lower extremity: Secondary | ICD-10-CM | POA: Diagnosis present

## 2023-09-20 DIAGNOSIS — I890 Lymphedema, not elsewhere classified: Secondary | ICD-10-CM | POA: Insufficient documentation

## 2023-09-20 DIAGNOSIS — I872 Venous insufficiency (chronic) (peripheral): Secondary | ICD-10-CM | POA: Insufficient documentation

## 2023-09-20 NOTE — Progress Notes (Signed)
RHYDER, KOEGEL ([id number]) [id number] Page 1 of 8 Visit Report for 09/20/2023 Allergy List Details Patient Name: Date of Service: Alexander Sweeney [place]. 09/20/2023 8:15 A M Medical Record Number: [id number] Patient Account Number: [id number] Date of Birth/Sex: Treating RN: August 02, 1976 (47 y.o. [name] Primary Care Sandhya Denherder: Marcelino Duster Other Clinician: Referring Yarenis Cerino: Treating Mahonri Seiden/Extender: Rosaland Lao in Treatment: 0 Allergies Active Allergies No Known Drug Allergies Allergy Notes Electronic Signature(s) Signed: 09/20/2023 4:19:17 PM By: Angelina Pih Entered By: Angelina Pih on 09/20/2023 05:41:28 -------------------------------------------------------------------------------- Arrival Information Details Patient Name: Date of Service: Alexander Lopes MA S W. 09/20/2023 8:15 A M Medical Record Number: [id number] Patient Account Number: [id number] Date of Birth/Sex: Treating RN: 11/29/1976 (47 y.o. [name] Primary Care Venancio Chenier: Marcelino Duster Other Clinician: Referring Loveda Colaizzi: Treating Lyndal Reggio/Extender: Rosaland Lao in Treatment: 0 Visit Information Patient Arrived: Ambulatory Arrival Time: 08:40 Accompanied By: family Transfer Assistance: None Patient Identification Verified: Yes Secondary Verification Process Completed: Yes Electronic Signature(s) Signed: 09/20/2023 4:19:17 PM By: Angelina Pih Entered By: Angelina Pih on 09/20/2023 05:41:02 Junius Creamer ([id number]) [id number] Page 2 of 8 -------------------------------------------------------------------------------- Clinic Level of Care Assessment Details Patient Name: Date of Service: Alexander Sweeney [place]. 09/20/2023 8:15 A M Medical Record Number: [id number] Patient Account Number: [id number] Date of Birth/Sex: Treating RN: 12/03/1976 (47 y.o. [name] Primary Care Jaimi Belle:  Marcelino Duster Other Clinician: Referring Zyren Sevigny: Treating Clarita Mcelvain/Extender: Rosaland Lao in Treatment: 0 Clinic Level of Care Assessment Items TOOL 2 Quantity Score []  - 0 Use when only an EandM is performed on the INITIAL visit ASSESSMENTS - Nursing Assessment / Reassessment X- 1 20 General Physical Exam (combine w/ comprehensive assessment (listed just below) when performed on new pt. evals) X- 1 25 Comprehensive Assessment (HX, ROS, Risk Assessments, Wounds Hx, etc.) ASSESSMENTS - Wound and Skin A ssessment / Reassessment []  - 0 Simple Wound Assessment / Reassessment - one wound []  - 0 Complex Wound Assessment / Reassessment - multiple wounds X- 1 10 Dermatologic / Skin Assessment (not related to wound area) ASSESSMENTS - Ostomy and/or Continence Assessment and Care []  - 0 Incontinence Assessment and Management []  - 0 Ostomy Care Assessment and Management (repouching, etc.) PROCESS - Coordination of Care X - Simple Patient / Family Education for ongoing care 1 15 []  - 0 Complex (extensive) Patient / Family Education for ongoing care X- 1 10 Staff obtains Chiropractor, Records, T Results / Process Orders est []  - 0 Staff telephones HHA, Nursing Homes / Clarify orders / etc []  - 0 Routine Transfer to another Facility (non-emergent condition) []  - 0 Routine Hospital Admission (non-emergent condition) X- 1 15 New Admissions / Manufacturing engineer / Ordering NPWT Apligraf, etc. , []  - 0 Emergency Hospital Admission (emergent condition) X- 1 10 Simple Discharge Coordination []  - 0 Complex (extensive) Discharge Coordination PROCESS - Special Needs []  - 0 Pediatric / Minor Patient Management []  - 0 Isolation Patient Management []  - 0 Hearing / Language / Visual special needs []  - 0 Assessment of Community assistance (transportation, D/C planning, etc.) []  - 0 Additional assistance / Altered mentation []  - 0 Support Surface(s) Assessment  (bed, cushion, seat, etc.) INTERVENTIONS - Wound Cleansing / Measurement X- 1 5 Wound Imaging (photographs - any number of wounds) []  - 0 Wound Tracing (instead of photographs) JHOVANI, GRISWOLD ([id number]) [id number] Page 3 of 8 []  - 0 Simple Wound Measurement - one wound []  -  0 Complex Wound Measurement - multiple wounds []  - 0 Simple Wound Cleansing - one wound []  - 0 Complex Wound Cleansing - multiple wounds INTERVENTIONS - Wound Dressings []  - 0 Small Wound Dressing one or multiple wounds []  - 0 Medium Wound Dressing one or multiple wounds []  - 0 Large Wound Dressing one or multiple wounds []  - 0 Application of Medications - injection INTERVENTIONS - Miscellaneous []  - 0 External ear exam []  - 0 Specimen Collection (cultures, biopsies, blood, body fluids, etc.) []  - 0 Specimen(s) / Culture(s) sent or taken to Lab for analysis []  - 0 Patient Transfer (multiple staff / Nurse, adult / Similar devices) []  - 0 Simple Staple / Suture removal (25 or less) []  - 0 Complex Staple / Suture removal (26 or more) []  - 0 Hypo / Hyperglycemic Management (close monitor of Blood Glucose) X- 1 15 Ankle / Brachial Index (ABI) - do not check if billed separately Has the patient been seen at the hospital within the last three years: Yes Total Score: 125 Level Of Care: New/Established - Level 4 Electronic Signature(s) Signed: 09/20/2023 4:19:17 PM By: Angelina Pih Entered By: Angelina Pih on 09/20/2023 09:23:17 -------------------------------------------------------------------------------- Encounter Discharge Information Details Patient Name: Date of Service: Alexander Lopes MA S W. 09/20/2023 8:15 A M Medical Record Number: [id number] Patient Account Number: [id number] Date of Birth/Sex: Treating RN: 1976/05/03 (47 y.o. [name] Primary Care Bryler Dibble: Marcelino Duster Other Clinician: Referring Toshiyuki Fredell: Treating Etheline Geppert/Extender: Rosaland Lao in Treatment: 0 Encounter Discharge Information Items Discharge Condition: Stable Ambulatory Status: Ambulatory Discharge Destination: Home Transportation: Private Auto Accompanied By: family Schedule Follow-up Appointment: Yes Clinical Summary of Care: Electronic Signature(s) Signed: 09/20/2023 12:25:11 PM By: Angelina Pih Entered By: Angelina Pih on 09/20/2023 09:25:11 Junius Creamer ([id number]) [id number] Page 4 of 8 -------------------------------------------------------------------------------- Lower Extremity Assessment Details Patient Name: Date of Service: Alexander Sweeney [place]. 09/20/2023 8:15 A M Medical Record Number: [id number] Patient Account Number: [id number] Date of Birth/Sex: Treating RN: 1976-04-14 (47 y.o. [name] Primary Care Chayce Robbins: Marcelino Duster Other Clinician: Referring Indi Willhite: Treating Nikhil Osei/Extender: Rosaland Lao in Treatment: 0 Edema Assessment Assessed: [Left: No] [Right: No] [Left: Edema] [Right: :] Calf Left: Right: Point of Measurement: 34 cm From Medial Instep 37.5 cm 37.3 cm Ankle Left: Right: Point of Measurement: 11 cm From Medial Instep 23 cm 21.5 cm Vascular Assessment Pulses: Dorsalis Pedis Palpable: [Left:Yes] [Right:Yes] Posterior Tibial Palpable: [Left:Yes] [Right:Yes] Extremity colors, hair growth, and conditions: Extremity Color: [Left:Hyperpigmented] [Right:Hyperpigmented] Hair Growth on Extremity: [Left:Yes] [Right:Yes] Temperature of Extremity: [Left:Cool] [Right:Cool] Capillary Refill: [Left:< 3 seconds] [Right:< 3 seconds] Blood Pressure: Brachial: [Left:144] [Right:144] Ankle: [Left:Dorsalis Pedis: 130 0.90] [Right:Dorsalis Pedis: 130 0.90] Toe Nail Assessment Left: Right: Thick: No No Discolored: No No Deformed: No No Improper Length and Hygiene: No No Electronic Signature(s) Signed: 09/20/2023 4:19:17 PM By: Angelina Pih Entered By: Angelina Pih on 09/20/2023 06:01:45 Junius Creamer ([id number]) [id number] Page 5 of 8 -------------------------------------------------------------------------------- Multi Wound Chart Details Patient Name: Date of Service: Alexander Sweeney [place]. 09/20/2023 8:15 A M Medical Record Number: [id number] Patient Account Number: [id number] Date of Birth/Sex: Treating RN: 11-02-1976 (47 y.o. [name] Primary Care Merinda Victorino: Marcelino Duster Other Clinician: Referring Carylon Tamburro: Treating Jafet Wissing/Extender: Rosaland Lao in Treatment: 0 Vital Signs Height(in): 73 Pulse(bpm): 78 Weight(lbs): 230 Blood Pressure(mmHg): 144/90 Body Mass Index(BMI): 30.3 Temperature(F): 97.5 Respiratory Rate(breaths/min): 18 [Treatment Notes:Wound Assessments Treatment Notes] Electronic Signature(s) Signed:  09/20/2023 12:21:17 PM By: Angelina Pih Entered By: Angelina Pih on 09/20/2023 09:21:17 -------------------------------------------------------------------------------- Multi-Disciplinary Care Plan Details Patient Name: Date of Service: Alexander Sweeney [place] W. 09/20/2023 8:15 A M Medical Record Number: [id number] Patient Account Number: [id number] Date of Birth/Sex: Treating RN: 10-Aug-1976 (47 y.o. [name] Primary Care Jacen Carlini: Marcelino Duster Other Clinician: Referring Donevan Biller: Treating Lyric Rossano/Extender: Rosaland Lao in Treatment: 0 Active Inactive Orientation to the Wound Care Program Nursing Diagnoses: Knowledge deficit related to the wound healing center program Goals: Patient/caregiver will verbalize understanding of the Wound Healing Center Program Date Initiated: 09/20/2023 Target Resolution Date: 10/18/2023 Goal Status: Active Interventions: Provide education on orientation to the wound center [place] ([id number]) [phone].pdf Page 6 of 8 Notes: Electronic  Signature(s) Signed: 09/20/2023 12:24:15 PM By: Angelina Pih Entered By: Angelina Pih on 09/20/2023 09:24:15 -------------------------------------------------------------------------------- Non-Wound Condition Assessment Details Patient Name: Date of Service: Doroteo Glassman. 09/20/2023 8:15 A M Medical Record Number: [id number] Patient Account Number: [id number] Date of Birth/Sex: Treating RN: Sep 06, 1976 (47 y.o. [name] Primary Care Trayonna Bachmeier: Marcelino Duster Other Clinician: Referring Mckinnley Cottier: Treating Tamari Busic/Extender: Rosaland Lao in Treatment: 0 Non-Wound Condition: Condition: Other Dermatologic Condition Location: Leg Side: Bilateral Photos Electronic Signature(s) Signed: 09/20/2023 4:19:17 PM By: Angelina Pih Entered By: Angelina Pih on 09/20/2023 06:02:23 -------------------------------------------------------------------------------- Pain Assessment Details Patient Name: Date of Service: Alexander Lopes MA S W. 09/20/2023 8:15 A M Medical Record Number: [id number] Patient Account Number: [id number] Date of Birth/Sex: Treating RN: [birth year] (47 y.o. [name] Primary Care Etosha Wetherell: Marcelino Duster Other Clinician: Referring Trestan Vahle: Treating Maygan Koeller/Extender: [place], Fitte[place], [place] ([id number]) [id number] Page 7 of 8 Weeks in Treatment: 0 Active Problems Location of Pain Severity and Description of Pain Patient Has Paino No Site Locations Rate the pain. Current Pain Level: 0 Pain Management and Medication Current Pain Management: Electronic Signature(s) Signed: 09/20/2023 4:19:17 PM By: Angelina Pih Entered By: Angelina Pih on 09/20/2023 05:41:11 -------------------------------------------------------------------------------- Patient/Caregiver Education Details Patient Name: Date of Service: Doroteo Glassman. 10/15/2024andnbsp8:15 A M Medical Record Number:  [id number] Patient Account Number: [id number] Date of Birth/Gender: Treating RN: [birth year] (47 y.o. [name] Primary Care Physician: Marcelino Duster Other Clinician: Referring Physician: Treating Physician/Extender: Rosaland Lao in Treatment: 0 Education Assessment Education Provided To: Patient and Caregiver father Education Topics Provided Welcome T [facility] Packet: o Handouts: [facility] Pledge form, Welcome T [facility] o Methods: Explain/Verbal Responses: State content correctly Electronic Signature(s) Signed: 09/20/2023 4:19:17 PM By: Angelina Pih Entered By: Angelina Pih on 09/20/2023 09:24:33 Junius Creamer ([id number]) [id number] Page 8 of 8 -------------------------------------------------------------------------------- Vitals Details Patient Name: Date of Service: Alexander Sweeney [place]. 09/20/2023 8:15 A M Medical Record Number: [id number] Patient Account Number: [id number] Date of Birth/Sex: Treating RN: 1976/05/05 (47 y.o. [name] Primary Care Kielee Care: Marcelino Duster Other Clinician: Referring Toniann Dickerson: Treating Yanira Tolsma/Extender: Rosaland Lao in Treatment: 0 Vital Signs Time Taken: 08:45 Temperature (F): 97.5 Height (in): 73 Pulse (bpm): 78 Source: Stated Respiratory Rate (breaths/min): 18 Weight (lbs): 230 Blood Pressure (mmHg): 144/90 Source: Stated Reference Range: 80 - 120 mg / dl Body Mass Index (BMI): 30.3 Electronic Signature(s) Signed: 09/20/2023 4:19:17 PM By: Angelina Pih Entered By: Angelina Pih on 09/20/2023 06:02:51

## 2023-09-20 NOTE — Progress Notes (Signed)
Alexander, Sweeney ([id number]) [phone] [id number] Page 1 of 5 Visit Report for 09/20/2023 Abuse Risk Screen Details Patient Name: Date of Service: Alexander Sweeney. 09/20/2023 8:15 A M Medical Record Number: [id number] Patient Account Number: [id number] Date of Birth/Sex: Treating RN: 1976/04/23 (47 y.o. Alexander Sweeney Primary Care Aira Sallade: Marcelino Duster Other Clinician: Referring Jnyah Brazee: Treating Jerriah Ines/Extender: Rosaland Lao in Treatment: 0 Abuse Risk Screen Items Answer ABUSE RISK SCREEN: Has anyone close to you tried to hurt or harm you recentlyo No Do you feel uncomfortable with anyone in your familyo No Has anyone forced you do things that you didnt want to doo No Electronic Signature(s) Signed: 09/20/2023 4:19:17 PM By: Angelina Pih Entered By: Angelina Pih on 09/20/2023 05:44:43 -------------------------------------------------------------------------------- Activities of Daily Living Details Patient Name: Date of Service: Alexander Sweeney. 09/20/2023 8:15 A M Medical Record Number: [id number] Patient Account Number: [id number] Date of Birth/Sex: Treating RN: 04/08/1976 (47 y.o. Alexander Sweeney Primary Care Salah Burlison: Marcelino Duster Other Clinician: Referring Tyjanae Bartek: Treating Adith Tejada/Extender: Rosaland Lao in Treatment: 0 Activities of Daily Living Items Answer Activities of Daily Living (Please select one for each item) Drive Automobile Not Able T Medications ake Need Assistance Use T elephone Need Assistance Care for Appearance Need Assistance Use T oilet Completely Able Bath / Shower Completely Able Dress Self Completely Able Feed Self Completely Able Walk Completely Able Get In / Out Bed Completely Able Housework Need Assistance SEBASTIN, PERLMUTTER ([id number]) [phone] [email or web address] Page 2 of 5 Prepare Meals Need Assistance Handle Money Need Assistance Shop  for Self Need Assistance Electronic Signature(s) Signed: 09/20/2023 4:19:17 PM By: Angelina Pih Entered By: Angelina Pih on 09/20/2023 05:45:24 -------------------------------------------------------------------------------- Education Screening Details Patient Name: Date of Service: Alexander Lopes MA S W. 09/20/2023 8:15 A M Medical Record Number: [id number] Patient Account Number: [id number] Date of Birth/Sex: Treating RN: 09/21/1976 (47 y.o. Alexander Sweeney Primary Care Azalie Harbeck: Marcelino Duster Other Clinician: Referring Kanton Kamel: Treating Mackenzye Mackel/Extender: Rosaland Lao in Treatment: 0 Primary Learner Assessed: Patient Learning Preferences/Education Level/Primary Language Learning Preference: Explanation, Demonstration, Video, Communication Board, Printed Material Preferred Language: English Cognitive Barrier Language Barrier: No Translator Needed: No Memory Deficit: No Emotional Barrier: No Cultural/Religious Beliefs Affecting Medical Care: No Physical Barrier Impaired Vision: Yes Glasses Impaired Hearing: No Decreased Hand dexterity: No Knowledge/Comprehension Knowledge Level: Medium Comprehension Level: Medium Ability to understand written instructions: Medium Ability to understand verbal instructions: Medium Motivation Anxiety Level: Calm Cooperation: Cooperative Education Importance: Acknowledges Need Interest in Health Problems: Asks Questions Perception: Coherent Willingness to Engage in Self-Management High Activities: Readiness to Engage in Self-Management High Activities: Electronic Signature(s) Signed: 09/20/2023 4:19:17 PM By: Angelina Pih Entered By: Angelina Pih on 09/20/2023 05:45:50 Junius Creamer ([id number]) [phone] [id number] Page 3 of 5 -------------------------------------------------------------------------------- Fall Risk Assessment Details Patient Name: Date of Service: Alexander Lopes [place]. 09/20/2023 8:15 A M Medical Record Number: [id number] Patient Account Number: [id number] Date of Birth/Sex: Treating RN: 27-Jan-1976 (47 y.o. Alexander Sweeney Primary Care Alexander Sweeney: Marcelino Duster Other Clinician: Referring Alexander Sweeney: Treating Alexander Sweeney/Extender: Rosaland Lao in Treatment: 0 Fall Risk Assessment Items Have you had 2 or more falls in the last 12 monthso 0 No Have you had any fall that resulted in injury in the last 12 monthso 0 No FALLS RISK SCREEN History of falling - immediate or within 3 months 0 No Secondary diagnosis (Do you have 2 or more medical diagnoseso)  0 No Ambulatory aid None/bed rest/wheelchair/nurse 0 Yes Crutches/cane/walker 0 No Furniture 0 No Intravenous therapy Access/Saline/Heparin Lock 0 No Gait/Transferring Normal/ bed rest/ wheelchair 0 Yes Weak (short steps with or without shuffle, stooped but able to lift head while walking, may seek 0 No support from furniture) Impaired (short steps with shuffle, may have difficulty arising from chair, head down, impaired 0 No balance) Mental Status Oriented to own ability 0 Yes Electronic Signature(s) Signed: 09/20/2023 4:19:17 PM By: Angelina Pih Entered By: Angelina Pih on 09/20/2023 05:45:57 -------------------------------------------------------------------------------- Foot Assessment Details Patient Name: Date of Service: Alexander Lopes MA S W. 09/20/2023 8:15 A M Medical Record Number: [id number] Patient Account Number: [id number] Date of Birth/Sex: Treating RN: [birth year] (47 y.o. Alexander Sweeney Primary Care Evalette Montrose: Marcelino Duster Other Clinician: Referring Alexander Sweeney: Treating Alexander Sweeney/Extender: Rosaland Lao in Treatment: 0 Foot Assessment Items Site Locations [place], [place] ([id number]) [phone] [id number] Page 4 of 5 + = Sensation present, - = Sensation absent, C = Callus, U = Ulcer R = Redness, W = Warmth, M =  Maceration, PU = Pre-ulcerative lesion F = Fissure, S = Swelling, D = Dryness Assessment Right: Left: Other Deformity: No No Prior Foot Ulcer: No No Prior Amputation: No No Charcot Joint: No No Ambulatory Status: Ambulatory Without Help Gait: Steady Electronic Signature(s) Signed: 09/20/2023 4:19:17 PM By: Angelina Pih Entered By: Angelina Pih on 09/20/2023 05:54:43 -------------------------------------------------------------------------------- Nutrition Risk Screening Details Patient Name: Date of Service: Doroteo Glassman. 09/20/2023 8:15 A M Medical Record Number: [id number] Patient Account Number: [id number] Date of Birth/Sex: Treating RN: Sep 05, 1976 (47 y.o. Alexander Sweeney Primary Care Ritu Gagliardo: Marcelino Duster Other Clinician: Referring Arnold Depinto: Treating Cassie Henkels/Extender: Rosaland Lao in Treatment: 0 Height (in): Weight (lbs): Body Mass Index (BMI): Nutrition Risk Screening Items Score Screening NUTRITION RISK SCREEN: I have an illness or condition that made me change the kind and/or amount of food I eat 0 No I eat fewer than two meals per day 0 No I eat few fruits and vegetables, or milk products 0 No I have three or more drinks of beer, liquor or wine almost every day 0 No I have tooth or mouth problems that make it hard for me to eat 0 No I don't always have enough money to buy the food I need 0 No JACORY, KAMEL ([id number]) [phone] [id number] Page 5 of 5 I eat alone most of the time 0 No I take three or more different prescribed or over-the-counter drugs a day 0 No Without wanting to, I have lost or gained 10 pounds in the last six months 0 No I am not always physically able to shop, cook and/or feed myself 0 No Nutrition Protocols Good Risk Protocol 0 No interventions needed Moderate Risk Protocol High Risk Proctocol Risk Level: Good Risk Score: 0 Electronic Signature(s) Signed: 09/20/2023 4:19:17 PM  By: Angelina Pih Entered By: Angelina Pih on 09/20/2023 05:46:07

## 2023-09-21 NOTE — Progress Notes (Addendum)
TENNY, HAUSMANN ([id number]) [id number] Page 1 of 8 Visit Report for 09/20/2023 Chief Complaint Document Details Patient Name: Date of Service: Alexander Sweeney [place]. 09/20/2023 8:15 A M Medical Record Number: [id number] Patient Account Number: [id number] Date of Birth/Sex: Treating RN: 05-14-1976 (47 y.o. Alexander Sweeney Primary Care Provider: Marcelino Duster Other Clinician: Referring Provider: Treating Provider/Extender: Rosaland Lao in Treatment: 0 Information Obtained from: Patient Chief Complaint Bilateral LE lymphedema Electronic Signature(s) Signed: 09/20/2023 9:25:42 AM By: Allen Derry PA-C Entered By: Allen Derry on 09/20/2023 06:25:41 -------------------------------------------------------------------------------- HPI Details Patient Name: Date of Service: Alexander Lopes MA S W. 09/20/2023 8:15 A M Medical Record Number: [id number] Patient Account Number: [id number] Date of Birth/Sex: Treating RN: January 12, 1976 (47 y.o. Alexander Sweeney Primary Care Provider: Marcelino Duster Other Clinician: Referring Provider: Treating Provider/Extender: Rosaland Lao in Treatment: 0 History of Present Illness HPI Description: 09-20-2023 upon evaluation today patient appears to be doing somewhat poorly in regard to his legs in regard to swelling. He is actually referred from dermatology where he goes regularly for general skin checks. With that being said they felt like that he had some issues here with swelling and this I do believe this is secondary to lymphedema. He does have chronic venous insufficiency which appears well with hemosiderin staining. I think the lymphedema is stage I to may be early stage II. With that being said he really has no other major medical problems although he does not wear compression at this point and really needs to be. He does not have any compression socks though his father who is present with him today  states that he wears compression on a regular basis and he actually wears the Medi dual layer compression which I think is actually doing quite well for him but I think that may be excellent for his son as well. Electronic Signature(s) Signed: 09/23/2023 7:45:39 AM By: Allen Derry PA-C Entered By: Allen Derry on 09/23/2023 04:45:39 Alexander Sweeney ([id number]) [id number] Page 2 of 8 -------------------------------------------------------------------------------- Physical Exam Details Patient Name: Date of Service: Alexander Sweeney [place]. 09/20/2023 8:15 A M Medical Record Number: [id number] Patient Account Number: [id number] Date of Birth/Sex: Treating RN: 07/09/1976 (47 y.o. Alexander Sweeney Primary Care Provider: Marcelino Duster Other Clinician: Referring Provider: Treating Provider/Extender: Rosaland Lao in Treatment: 0 Constitutional Well-nourished and well-hydrated in no acute distress. Respiratory normal breathing without difficulty. Psychiatric this patient is able to make decisions and demonstrates good insight into disease process. Alert and Oriented x 3. pleasant and cooperative. Notes Upon inspection patient's wound bed actually showed signs of no open wounds at this point. Fortunately I do not see any signs of active infection which is good I do see where he is had areas of dry skin in general I think he needs to be taken care of his skin better I recommend AandD ointment bilaterally to the legs on a regular basis here. Electronic Signature(s) Signed: 09/23/2023 7:46:04 AM By: Allen Derry PA-C Entered By: Allen Derry on 09/23/2023 04:46:04 -------------------------------------------------------------------------------- Physician Orders Details Patient Name: Date of Service: Alexander Lopes MA S W. 09/20/2023 8:15 A M Medical Record Number: [id number] Patient Account Number: [id number] Date of Birth/Sex: Treating RN: [birth year] (47 y.o.  Alexander Sweeney Primary Care Provider: Marcelino Duster Other Clinician: Referring Provider: Treating Provider/Extender: Rosaland Lao in Treatment: 0 Verbal / Phone Orders: No Diagnosis Coding ICD-10 Coding Code Description [phone] Chronic venous  hypertension (idiopathic) with inflammation of bilateral lower extremity I89.0 Lymphedema, not elsewhere classified Follow-up Appointments Return Appointment in 1 month [address] [place], [place] ([id number]) [id number] Page 3 of 8 May shower; gently cleanse wound with antibacterial soap, rinse and pat dry prior to dressing wounds Edema Control - Lymphedema / Segmental Compressive Device / Other Tubigrip single layer applied. - D Elevate, Exercise Daily and A void Standing for Long Periods of Time. Elevate leg(s) parallel to the floor when sitting. DO YOUR BEST to sleep in the bed at night. DO NOT sleep in your recliner. Long hours of sitting in a recliner leads to swelling of the legs and/or potential wounds on your backside. Non-Wound Condition Bilateral Lower Extremities dditional non-wound orders/instructions: - A and D ointment 2 x daily to bilateral lower legs for 2 weeks. Tubi grip for using now size D Derma A saver recommended for issues with bumping into things. Compression socks daily recommended. 20-30 mmHg recommended. [facility] on [address] carries mediven plus compression socks Electronic Signature(s) Signed: 09/20/2023 12:22:49 PM By: Angelina Pih Signed: 09/20/2023 6:10:25 PM By: Allen Derry PA-C Entered By: Angelina Pih on 09/20/2023 09:22:49 -------------------------------------------------------------------------------- Problem List Details Patient Name: Date of Service: Alexander Lopes MA S W. 09/20/2023 8:15 A M Medical Record Number: [id number] Patient Account Number: [id number] Date of Birth/Sex: Treating RN: March 19, 1976 (47 y.o. Alexander Sweeney Primary Care Provider: Marcelino Duster Other Clinician: Referring Provider: Treating Provider/Extender: Rosaland Lao in Treatment: 0 Active Problems ICD-10 Encounter Code Description Active Date MDM Diagnosis I87.323 Chronic venous hypertension (idiopathic) with inflammation of bilateral lower 09/20/2023 No Yes extremity I89.0 Lymphedema, not elsewhere classified 09/20/2023 No Yes Inactive Problems Resolved Problems Electronic Signature(s) Signed: 09/20/2023 9:25:19 AM By: Allen Derry PA-C Entered By: Allen Derry on 09/20/2023 06:25:19 Alexander Sweeney ([id number]) [id number] Page 4 of 8 -------------------------------------------------------------------------------- Progress Note Details Patient Name: Date of Service: Alexander Sweeney [place]. 09/20/2023 8:15 A M Medical Record Number: [id number] Patient Account Number: [id number] Date of Birth/Sex: Treating RN: 11-07-1976 (47 y.o. Alexander Sweeney Primary Care Provider: Marcelino Duster Other Clinician: Referring Provider: Treating Provider/Extender: Rosaland Lao in Treatment: 0 Subjective Chief Complaint Information obtained from Patient Bilateral LE lymphedema History of Present Illness (HPI) 09-20-2023 upon evaluation today patient appears to be doing somewhat poorly in regard to his legs in regard to swelling. He is actually referred from dermatology where he goes regularly for general skin checks. With that being said they felt like that he had some issues here with swelling and this I do believe this is secondary to lymphedema. He does have chronic venous insufficiency which appears well with hemosiderin staining. I think the lymphedema is stage I to may be early stage II. With that being said he really has no other major medical problems although he does not wear compression at this point and really needs to be. He does not have any compression  socks though his father who is present with him today states that he wears compression on a regular basis and he actually wears the Medi dual layer compression which I think is actually doing quite well for him but I think that may be excellent for his son as well. Patient History Information obtained from Patient, Caregiver, Chart. Allergies No Known Drug Allergies Social History Never smoker, Alcohol Use - Never, Drug Use - No History, Caffeine Use - Never. Medical History Hematologic/Lymphatic Patient has history of Anemia Medical A  Surgical History Notes nd Cardiovascular venous stasis Endocrine takes metformin Review of Systems (ROS) Constitutional Symptoms (General Health) Denies complaints or symptoms of Fatigue, Fever, Chills, Marked Weight Change. Eyes Complains or has symptoms of Glasses / Contacts. Ear/Nose/Mouth/Throat Denies complaints or symptoms of Difficult clearing ears, Sinusitis. Respiratory Denies complaints or symptoms of Chronic or frequent coughs, Shortness of Breath. Cardiovascular Denies complaints or symptoms of Chest pain, LE edema. Gastrointestinal Denies complaints or symptoms of Frequent diarrhea, Nausea, Vomiting, gerd Endocrine Denies complaints or symptoms of Hepatitis, Thyroid disease, Polydypsia (Excessive Thirst). Genitourinary kidney stones Immunological Denies complaints or symptoms of Hives, Itching. Integumentary (Skin) Complains or has symptoms of Wounds - years wounds to bilat LL, Benign nevi seborrheic keratoses Musculoskeletal Denies complaints or symptoms of Muscle Pain, Muscle Weakness. 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 Alexander Sweeney, Alexander Sweeney ([id number]) [id number] Page 5 of 8 Objective Constitutional Well-nourished and well-hydrated in no acute distress. Vitals Time Taken: 8:45 AM, Height: 73 in, Source: Stated,  Weight: 230 lbs, Source: Stated, BMI: 30.3, Temperature: 97.5 F, Pulse: 78 bpm, Respiratory Rate: 18 breaths/min, Blood Pressure: 144/90 mmHg. Respiratory normal breathing without difficulty. Psychiatric this patient is able to make decisions and demonstrates good insight into disease process. Alert and Oriented x 3. pleasant and cooperative. General Notes: Upon inspection patient's wound bed actually showed signs of no open wounds at this point. Fortunately I do not see any signs of active infection which is good I do see where he is had areas of dry skin in general I think he needs to be taken care of his skin better I recommend AandD ointment bilaterally to the legs on a regular basis here. Other Condition(s) Patient presents with Other Dermatologic Condition located on the Bilateral Leg. Assessment Active Problems ICD-10 Chronic venous hypertension (idiopathic) with inflammation of bilateral lower extremity Lymphedema, not elsewhere classified Plan Follow-up Appointments: Return Appointment in 1 month Bathing/ Shower/ Hygiene: May shower; gently cleanse wound with antibacterial soap, rinse and pat dry prior to dressing wounds Edema Control - Lymphedema / Segmental Compressive Device / Other: Tubigrip single layer applied. - D Elevate, Exercise Daily and Avoid Standing for Long Periods of Time. Elevate leg(s) parallel to the floor when sitting. DO YOUR BEST to sleep in the bed at night. DO NOT sleep in your recliner. Long hours of sitting in a recliner leads to swelling of the legs and/or potential wounds on your backside. Non-Wound Condition: Additional non-wound orders/instructions: - A and D ointment 2 x daily to bilateral lower legs for 2 weeks. Tubi grip for using now size D Derma saver recommended for issues with bumping into things. Compression socks daily recommended. 20-30 mmHg recommended. [facility] on [address] carries mediven plus compression socks 1.  Based on what I am seeing I do believe that the patient really is going require some compression therapy and I discussed that with the patient and father today and my suggestion is that he for now have Tubigrip for the next 2 weeks and then use AandD ointment twice a day to get the skin in better shape. Once that is achieved he needs to go into his compression socks which I would recommend he go ahead and get measured for and I did recommend the Medi dual layer compression which I think would be excellent for him. 2. I do believe the patient needs to be exercising, elevating his legs, and making sure that again he applies the ointment twice  a day to keep the skin in good shape here. Will see him back for follow-up visit in 1 months time to make sure everything is doing well that he does not have any additional issues or complications at that point. Electronic Signature(s) Signed: 09/23/2023 7:46:56 AM By: Allen Derry PA-C Entered By: Allen Derry on 09/23/2023 04:46:56 Alexander Sweeney ([id number]) [id number] Page 6 of 8 -------------------------------------------------------------------------------- ROS/PFSH Details Patient Name: Date of Service: Alexander Sweeney [place]. 09/20/2023 8:15 A M Medical Record Number: [id number] Patient Account Number: [id number] Date of Birth/Sex: Treating RN: 1975-12-20 (47 y.o. Alexander Sweeney Primary Care Provider: Marcelino Duster Other Clinician: Referring Provider: Treating Provider/Extender: Rosaland Lao in Treatment: 0 Information Obtained From Patient Caregiver Chart Constitutional Symptoms (General Health) Complaints and Symptoms: Negative for: Fatigue; Fever; Chills; Marked Weight Change Eyes Complaints and Symptoms: Positive for: Glasses / Contacts Ear/Nose/Mouth/Throat Complaints and Symptoms: Negative for: Difficult clearing ears; Sinusitis Respiratory Complaints and Symptoms: Negative for: Chronic  or frequent coughs; Shortness of Breath Cardiovascular Complaints and Symptoms: Negative for: Chest pain; LE edema Medical History: Past Medical History Notes: venous stasis Gastrointestinal Complaints and Symptoms: Negative for: Frequent diarrhea; Nausea; Vomiting Review of System Notes: gerd Endocrine Complaints and Symptoms: Negative for: Hepatitis; Thyroid disease; Polydypsia (Excessive Thirst) Medical History: Past Medical History Notes: takes metformin Immunological Complaints and Symptoms: Negative for: Hives; Itching Integumentary (Skin) Complaints and Symptoms: Positive for: Wounds - years wounds to bilat LL Review of System Notes: [address] Alexander Sweeney, Alexander Sweeney ([id number]) [id number] Page 7 of 8 seborrheic keratoses Musculoskeletal Complaints and Symptoms: Negative for: Muscle Pain; Muscle Weakness Psychiatric Complaints and Symptoms: Negative for: Anxiety; Claustrophobia Hematologic/Lymphatic Medical History: Positive for: Anemia Genitourinary Complaints and Symptoms: Review of System Notes: kidney stones Neurologic Complaints and Symptoms: Review of System Notes: cerebral palsy Oncologic Complaints and Symptoms: Review of System Notes: basal cell CA Immunizations Pneumococcal Vaccine: Received Pneumococcal Vaccination: No Implantable Devices None Family and Social History Never smoker; Alcohol Use: Never; Drug Use: No History; Caffeine Use: Never Electronic Signature(s) Signed: 09/20/2023 4:19:17 PM By: Angelina Pih Signed: 09/20/2023 6:10:25 PM By: Allen Derry PA-C Entered By: Angelina Pih on 09/20/2023 06:07:57 -------------------------------------------------------------------------------- SuperBill Details Patient Name: Date of Service: Alexander Lopes MA S W. 09/20/2023 Medical Record Number: [id number] Patient Account Number: [id number] Date of Birth/Sex: Treating RN: March 12, 1976 (47 y.o. Alexander Sweeney Primary Care Provider: Marcelino Duster Other Clinician: Referring Provider: Treating Provider/Extender: Rosaland Lao in Treatment: 0 Diagnosis [address] DIETRICK, Alexander Sweeney ([id number]) [id number] Page 8 of 8 ICD-10 Codes Code Description [phone] Chronic venous hypertension (idiopathic) with inflammation of bilateral lower extremity I89.0 Lymphedema, not elsewhere classified Facility Procedures : CPT4 Code: [id number] Description: 99214 - WOUND CARE VISIT-LEV 4 EST PT Modifier: Quantity: 1 Physician Procedures : CPT4 Code Description Modifier [id number] WC PHYS LEVEL 3 NEW PT ICD-10 Diagnosis Description I87.323 Chronic venous hypertension (idiopathic) with inflammation of bilateral lower extremity I89.0 Lymphedema, not elsewhere classified Quantity: 1 Electronic Signature(s) Signed: 09/20/2023 6:02:01 PM By: Allen Derry PA-C Previous Signature: 09/20/2023 12:23:24 PM Version By: Angelina Pih Entered By: Allen Derry on 09/20/2023 15:02:01

## 2023-10-18 ENCOUNTER — Ambulatory Visit: Payer: Medicare PPO | Admitting: Physician Assistant

## 2023-10-19 ENCOUNTER — Encounter: Payer: Medicare PPO | Attending: Physician Assistant | Admitting: Physician Assistant

## 2023-10-19 DIAGNOSIS — I87323 Chronic venous hypertension (idiopathic) with inflammation of bilateral lower extremity: Secondary | ICD-10-CM | POA: Diagnosis present

## 2023-10-19 DIAGNOSIS — I890 Lymphedema, not elsewhere classified: Secondary | ICD-10-CM | POA: Diagnosis not present

## 2023-10-19 NOTE — Progress Notes (Addendum)
EUCLIDE, MENNA ([id number]) [id number] Page 1 of 5 Visit Report for 10/19/2023 Chief Complaint Document Details Patient Name: Date of Service: Alexander Sweeney [place]. 10/19/2023 1:15 PM Medical Record Number: [id number] Patient Account Number: [id number] Date of Birth/Sex: Treating RN: Feb 22, 1976 (47 y.o. Alexander Sweeney Primary Care Provider: Marcelino Sweeney Other Clinician: Referring Provider: Treating Provider/Extender: Joylene Grapes in Treatment: 4 Information Obtained from: Patient Chief Complaint Bilateral LE lymphedema Electronic Signature(s) Signed: 10/19/2023 1:53:09 PM By: Allen Derry PA-C Entered By: Allen Derry on 10/19/2023 13:53:09 -------------------------------------------------------------------------------- HPI Details Patient Name: Date of Service: Alexander Lopes MA S W. 10/19/2023 1:15 PM Medical Record Number: [id number] Patient Account Number: [id number] Date of Birth/Sex: Treating RN: Apr 15, 1976 (47 y.o. Alexander Sweeney Primary Care Provider: Marcelino Sweeney Other Clinician: Referring Provider: Treating Provider/Extender: Joylene Grapes in Treatment: 4 History of Present Illness HPI Description: 09-20-2023 upon evaluation today patient appears to be doing somewhat poorly in regard to his legs in regard to swelling. He is actually referred from dermatology where he goes regularly for general skin checks. With that being said they felt like that he had some issues here with swelling and this I do believe this is secondary to lymphedema. He does have chronic venous insufficiency which appears well with hemosiderin staining. I think the lymphedema is stage I to may be early stage II. With that being said he really has no other major medical problems although he does not wear compression at this point and really needs to be. He does not have any compression socks though his father who is present with him today  states that he wears compression on a regular basis and he actually wears the Medi dual layer compression which I think is actually doing quite well for him but I think that may be excellent for his son as well. 10-19-2023 upon evaluation today patient appears to be doing well currently in regard to his legs there is nothing really open at this point which is good news and in general I do think that we are making good headway here towards closure. In fact I do not think he has any real open wounds he does have some dry skin regions he has been using Eucerin cream which seems to be doing well for him unfortunately the AMD caused some reaction he did not seem to do so well with that he has been using Tubigrip although they are going to be getting him some compression socks I think a 15 to 20 mmHg compression would be ideal here. They voiced understanding and are in agreement with that plan. Electronic Signature(s) Sweeney, Alexander ([id number]) [id number] Page 2 of 5 Signed: 10/19/2023 3:07:47 PM By: Allen Derry PA-C Entered By: Allen Derry on 10/19/2023 15:07:47 -------------------------------------------------------------------------------- Physical Exam Details Patient Name: Date of Service: Alexander Sweeney. 10/19/2023 1:15 PM Medical Record Number: [id number] Patient Account Number: [id number] Date of Birth/Sex: Treating RN: May 17, 1976 (47 y.o. Alexander Sweeney Primary Care Provider: Marcelino Sweeney Other Clinician: Referring Provider: Treating Provider/Extender: Joylene Grapes in Treatment: 4 Constitutional Obese and well-hydrated in no acute distress. Respiratory normal breathing without difficulty. Psychiatric this patient is able to make decisions and demonstrates good insight into disease process. Alert and Oriented x 3. pleasant and cooperative. Notes Upon inspection patient's wound bed actually showed signs of being completely closed  there is nothing open at this point and in general he seems to be doing  quite well. He is not really having any significant swelling the Tubigrip's done pretty well for him. Electronic Signature(s) Signed: 10/19/2023 3:19:07 PM By: Allen Derry PA-C Entered By: Allen Derry on 10/19/2023 15:19:07 -------------------------------------------------------------------------------- Physician Orders Details Patient Name: Date of Service: Alexander Lopes MA S W. 10/19/2023 1:15 PM Medical Record Number: [id number] Patient Account Number: [id number] Date of Birth/Sex: Treating RN: 11/25/1976 (47 y.o. Alexander Sweeney Primary Care Provider: Marcelino Sweeney Other Clinician: Referring Provider: Treating Provider/Extender: Joylene Grapes in Treatment: 4 The following information was scribed by: Alexander Sweeney The information was scribed for: Allen Derry Verbal / Phone Orders: No Diagnosis Coding ICD-10 Coding Code Description [phone] Chronic venous hypertension (idiopathic) with inflammation of bilateral lower extremity Alexander Sweeney, Alexander Sweeney ([id number]) [id number] Page 3 of 5 I89.0 Lymphedema, not elsewhere classified Discharge From [facility] Services Discharge from Wound Care Center Treatment Complete - Continue Eucerin to both legs at night and compression stockings during the day Electronic Signature(s) Signed: 10/20/2023 7:57:50 AM By: Allen Derry PA-C Signed: 10/20/2023 5:33:20 PM By: Alexander Aver MSN RN CNS WTA Entered By: Alexander Sweeney on 10/19/2023 14:09:17 -------------------------------------------------------------------------------- Problem List Details Patient Name: Date of Service: Alexander Lopes MA S W. 10/19/2023 1:15 PM Medical Record Number: [id number] Patient Account Number: [id number] Date of Birth/Sex: Treating RN: [birth year] (47 y.o. Alexander Sweeney Primary Care Provider: Marcelino Sweeney Other Clinician: Referring Provider: Treating Provider/Extender:  Joylene Grapes in Treatment: 4 Active Problems ICD-10 Encounter Code Description Active Date MDM Diagnosis I87.323 Chronic venous hypertension (idiopathic) with inflammation of bilateral lower 09/20/2023 No Yes extremity I89.0 Lymphedema, not elsewhere classified 09/20/2023 No Yes Inactive Problems Resolved Problems Electronic Signature(s) Signed: 10/20/2023 7:57:50 AM By: Allen Derry PA-C Signed: 10/20/2023 5:33:20 PM By: Alexander Aver MSN RN CNS WTA Previous Signature: 10/19/2023 1:53:05 PM Version By: Allen Derry PA-C Entered By: Alexander Sweeney on 10/19/2023 14:10:35 Progress Note Details -------------------------------------------------------------------------------- Alexander Sweeney ([id number]) [id number] Page 4 of 5 Patient Name: Date of Service: Alexander Sweeney [place]. 10/19/2023 1:15 PM Medical Record Number: [id number] Patient Account Number: [id number] Date of Birth/Sex: Treating RN: 11/11/1976 (47 y.o. Alexander Sweeney Primary Care Provider: Marcelino Sweeney Other Clinician: Referring Provider: Treating Provider/Extender: Joylene Grapes in Treatment: 4 Subjective Chief Complaint Information obtained from Patient Bilateral LE lymphedema History of Present Illness (HPI) 09-20-2023 upon evaluation today patient appears to be doing somewhat poorly in regard to his legs in regard to swelling. He is actually referred from dermatology where he goes regularly for general skin checks. With that being said they felt like that he had some issues here with swelling and this I do believe this is secondary to lymphedema. He does have chronic venous insufficiency which appears well with hemosiderin staining. I think the lymphedema is stage I to may be early stage II. With that being said he really has no other major medical problems although he does not wear compression at this point and really needs to be. He does not have any  compression socks though his father who is present with him today states that he wears compression on a regular basis and he actually wears the Medi dual layer compression which I think is actually doing quite well for him but I think that may be excellent for his son as well. 10-19-2023 upon evaluation today patient appears to be doing well currently in regard to his legs there is nothing really open at this point which is good news  and in general I do think that we are making good headway here towards closure. In fact I do not think he has any real open wounds he does have some dry skin regions he has been using Eucerin cream which seems to be doing well for him unfortunately the AMD caused some reaction he did not seem to do so well with that he has been using Tubigrip although they are going to be getting him some compression socks I think a 15 to 20 mmHg compression would be ideal here. They voiced understanding and are in agreement with that plan. Objective Constitutional Obese and well-hydrated in no acute distress. Vitals Time Taken: 1:34 PM, Height: 73 in, Weight: 230 lbs, BMI: 30.3, Temperature: 97.2 F, Pulse: 102 bpm, Respiratory Rate: 18 breaths/min, Blood Pressure: 147/105 mmHg. Respiratory normal breathing without difficulty. Psychiatric this patient is able to make decisions and demonstrates good insight into disease process. Alert and Oriented x 3. pleasant and cooperative. General Notes: Upon inspection patient's wound bed actually showed signs of being completely closed there is nothing open at this point and in general he seems to be doing quite well. He is not really having any significant swelling the Tubigrip's done pretty well for him. Other Condition(s) Patient presents with Other Dermatologic Condition located on the Bilateral Leg. Lymphedema Assessment Symptoms: (check all that apply): Dermatitis, Hyperpigmentation Assessment Active Problems ICD-10 Chronic venous  hypertension (idiopathic) with inflammation of bilateral lower extremity Lymphedema, not elsewhere classified Plan Discharge From [facility] Services: Discharge from Wound Care Center Treatment Complete - Continue Eucerin to both legs at night and compression stockings during the day Alexander Sweeney, Alexander Sweeney ([id number]) [id number] Page 5 of 5 1. I would recommend based on what we see him that we actually have him use a little bit lighter compression I think a 15 to 20 mmHg is can be sufficient. 2. I am would recommend as well the patient should continue to monitor for any signs of infection or worsening. If anything changes he knows to contact the office and let me know. Will see him back for a follow-up visit as needed this was discussed with patient and dad. Electronic Signature(s) Signed: 10/19/2023 3:19:32 PM By: Allen Derry PA-C Entered By: Allen Derry on 10/19/2023 15:19:31 -------------------------------------------------------------------------------- SuperBill Details Patient Name: Date of Service: Alexander Lopes MA S W. 10/19/2023 Medical Record Number: [id number] Patient Account Number: [id number] Date of Birth/Sex: Treating RN: 16-May-1976 (47 y.o. Alexander Sweeney Primary Care Provider: Marcelino Sweeney Other Clinician: Referring Provider: Treating Provider/Extender: Joylene Grapes in Treatment: 4 Diagnosis Coding ICD-10 Codes Code Description [phone] Chronic venous hypertension (idiopathic) with inflammation of bilateral lower extremity I89.0 Lymphedema, not elsewhere classified Facility Procedures : CPT4 Code: [id number] Description: [id number] - WOUND CARE VISIT-LEV 2 EST PT Modifier: Quantity: 1 Physician Procedures : CPT4 Code Description Modifier [id number] - WC PHYS LEVEL 3 - EST PT ICD-10 Diagnosis Description I87.323 Chronic venous hypertension (idiopathic) with inflammation of bilateral lower extremity I89.0 Lymphedema, not elsewhere  classified Quantity: 1 Electronic Signature(s) Signed: 10/19/2023 3:20:07 PM By: Allen Derry PA-C Entered By: Allen Derry on 10/19/2023 15:20:07

## 2023-10-21 NOTE — Progress Notes (Signed)
REACE, BREHMER ([id number]) [id number] Page 1 of 7 Visit Report for 10/19/2023 Arrival Information Details Patient Name: Date of Service: Alexander Sweeney [place]. 10/19/2023 1:15 PM Medical Record Number: [id number] Patient Account Number: [id number] Date of Birth/Sex: Treating RN: 1976-05-20 (46 y.o. [name] Primary Care Conner Muegge: Marcelino Duster Other Clinician: Referring Sahiti Joswick: Treating Zyia Kaneko/Extender: Joylene Grapes in Treatment: 4 Visit Information History Since Last Visit Added or deleted any medications: No Patient Arrived: Ambulatory Any new allergies or adverse reactions: No Arrival Time: 13:28 Has Dressing in Place as Prescribed: Yes Accompanied By: father Pain Present Now: No Transfer Assistance: None Patient Identification Verified: Yes Secondary Verification Process Completed: Yes Patient Requires Transmission-Based Precautions: No Patient Has Alerts: No Electronic Signature(s) Signed: 10/20/2023 5:33:20 PM By: Midge Aver MSN RN CNS WTA Entered By: Midge Aver on 10/19/2023 13:34:08 -------------------------------------------------------------------------------- Clinic Level of Care Assessment Details Patient Name: Date of Service: Alexander Sweeney [place]. 10/19/2023 1:15 PM Medical Record Number: [id number] Patient Account Number: [id number] Date of Birth/Sex: Treating RN: September 12, 1976 (47 y.o. [name] Primary Care Avik Leoni: Marcelino Duster Other Clinician: Referring Jeriko Kowalke: Treating Peony Barner/Extender: Joylene Grapes in Treatment: 4 Clinic Level of Care Assessment Items TOOL 4 Quantity Score X- 1 0 Use when only an EandM is performed on FOLLOW-UP visit ASSESSMENTS - Nursing Assessment / Reassessment X- 1 10 Reassessment of Co-morbidities (includes updates in patient status) X- 1 5 Reassessment of Adherence to Treatment Plan ASSESSMENTS - Wound and Skin A ssessment / Reassessment []  -  0 Simple Wound Assessment / Reassessment - one wound []  - 0 Complex Wound Assessment / Reassessment - multiple wounds Alexander Sweeney, Alexander Sweeney ([id number]) [id number] Page 2 of 7 []  - 0 Dermatologic / Skin Assessment (not related to wound area) ASSESSMENTS - Focused Assessment []  - 0 Circumferential Edema Measurements - multi extremities []  - 0 Nutritional Assessment / Counseling / Intervention []  - 0 Lower Extremity Assessment (monofilament, tuning fork, pulses) []  - 0 Peripheral Arterial Disease Assessment (using hand held doppler) ASSESSMENTS - Ostomy and/or Continence Assessment and Care []  - 0 Incontinence Assessment and Management []  - 0 Ostomy Care Assessment and Management (repouching, etc.) PROCESS - Coordination of Care X - Simple Patient / Family Education for ongoing care 1 15 []  - 0 Complex (extensive) Patient / Family Education for ongoing care []  - 0 Staff obtains Chiropractor, Records, T Results / Process Orders est []  - 0 Staff telephones HHA, Nursing Homes / Clarify orders / etc []  - 0 Routine Transfer to another Facility (non-emergent condition) []  - 0 Routine Hospital Admission (non-emergent condition) []  - 0 New Admissions / Manufacturing engineer / Ordering NPWT Apligraf, etc. , []  - 0 Emergency Hospital Admission (emergent condition) X- 1 10 Simple Discharge Coordination []  - 0 Complex (extensive) Discharge Coordination PROCESS - Special Needs []  - 0 Pediatric / Minor Patient Management []  - 0 Isolation Patient Management []  - 0 Hearing / Language / Visual special needs []  - 0 Assessment of Community assistance (transportation, D/C planning, etc.) []  - 0 Additional assistance / Altered mentation []  - 0 Support Surface(s) Assessment (bed, cushion, seat, etc.) INTERVENTIONS - Wound Cleansing / Measurement []  - 0 Simple Wound Cleansing - one wound []  - 0 Complex Wound Cleansing - multiple wounds X- 1 5 Wound Imaging  (photographs - any number of wounds) []  - 0 Wound Tracing (instead of photographs) []  - 0 Simple Wound Measurement - one wound []  - 0 Complex Wound  Measurement - multiple wounds INTERVENTIONS - Wound Dressings []  - 0 Small Wound Dressing one or multiple wounds []  - 0 Medium Wound Dressing one or multiple wounds []  - 0 Large Wound Dressing one or multiple wounds []  - 0 Application of Medications - topical []  - 0 Application of Medications - injection INTERVENTIONS - Miscellaneous []  - 0 External ear exam []  - 0 Specimen Collection (cultures, biopsies, blood, body fluids, etc.) []  - 0 Specimen(s) / Culture(s) sent or taken to Lab for analysis Alexander Sweeney, Alexander Sweeney ([id number]) [id number] Page 3 of 7 []  - 0 Patient Transfer (multiple staff / Nurse, adult / Similar devices) []  - 0 Simple Staple / Suture removal (25 or less) []  - 0 Complex Staple / Suture removal (26 or more) []  - 0 Hypo / Hyperglycemic Management (close monitor of Blood Glucose) []  - 0 Ankle / Brachial Index (ABI) - do not check if billed separately X- 1 5 Vital Signs Has the patient been seen at the hospital within the last three years: Yes Total Score: 50 Level Of Care: New/Established - Level 2 Electronic Signature(s) Signed: 10/20/2023 5:33:20 PM By: Midge Aver MSN RN CNS WTA Entered By: Midge Aver on 10/19/2023 14:09:52 -------------------------------------------------------------------------------- Encounter Discharge Information Details Patient Name: Date of Service: Alexander Lopes MA S W. 10/19/2023 1:15 PM Medical Record Number: [id number] Patient Account Number: [id number] Date of Birth/Sex: Treating RN: 1976-09-05 (47 y.o. [name] Primary Care Titiana Severa: Marcelino Duster Other Clinician: Referring Jamayia Croker: Treating Anyra Kaufman/Extender: Joylene Grapes in Treatment: 4 Encounter Discharge Information Items Discharge Condition: Stable Ambulatory Status:  Ambulatory Discharge Destination: Home Transportation: Private Auto Accompanied By: father Schedule Follow-up Appointment: No Clinical Summary of Care: Electronic Signature(s) Signed: 10/20/2023 5:33:20 PM By: Midge Aver MSN RN CNS WTA Entered By: Midge Aver on 10/19/2023 14:11:15 -------------------------------------------------------------------------------- Lower Extremity Assessment Details Patient Name: Date of Service: Alexander Ditto W. 10/19/2023 1:15 PM Medical Record Number: [id number] Patient Account Number: [id number] Date of Birth/Sex: Treating RN: 1976-05-09 (47 y.o. [name] Primary Care Dontray Haberland: Marcelino Duster Other Clinician: Referring Raymund Manrique: Treating Rob Mciver/Extender: Minette Headland [place], [place] ([id number]) [id number] Page 4 of 7 Weeks in Treatment: 4 Edema Assessment Assessed: [Left: No] [Right: No] [Left: Edema] [Right: :] Calf Left: Right: Point of Measurement: 34 cm From Medial Instep 37.5 cm 37.3 cm Ankle Left: Right: Point of Measurement: 11 cm From Medial Instep 23 cm 21.5 cm Vascular Assessment Pulses: Dorsalis Pedis Palpable: [Left:Yes] [Right:Yes] Extremity colors, hair growth, and conditions: Extremity Color: [Left:Hyperpigmented] [Right:Hyperpigmented] Hair Growth on Extremity: [Left:No] [Right:No] Temperature of Extremity: [Left:Warm] [Right:Warm] Capillary Refill: [Left:< 3 seconds] [Right:< 3 seconds] Dependent Rubor: [Left:No] [Right:No] Blanched when Elevated: [Left:No No] [Right:No No] Toe Nail Assessment Left: Right: Thick: No No Discolored: No No Deformed: No No Improper Length and Hygiene: No No Electronic Signature(s) Signed: 10/20/2023 5:33:20 PM By: Midge Aver MSN RN CNS WTA Entered By: Midge Aver on 10/19/2023 13:39:51 -------------------------------------------------------------------------------- Multi Wound Chart Details Patient Name: Date of Service: Alexander Lopes MA S W. 10/19/2023 1:15 PM Medical Record Number: [id number] Patient Account Number: [id number] Date of Birth/Sex: Treating RN: [birth year] (47 y.o. [name] Primary Care Taniaya Rudder: Marcelino Duster Other Clinician: Referring Doyce Stonehouse: Treating Antawan Mchugh/Extender: Joylene Grapes in Treatment: 4 Vital Signs Height(in): 73 Pulse(bpm): 102 Weight(lbs): 230 Blood Pressure(mmHg): 147/105 Body Mass Index(BMI): 30.3 Temperature(F): 97.2 Respiratory Rate(breaths/min): [address] ([id number]) Electronic Signature(s) Signed: 10/20/2023 5:33:20 PM By: Midge Aver MSN RN  CNS WTA Entered By: Midge Aver on 10/19/2023 13:41:14 -------------------------------------------------------------------------------- Multi-Disciplinary Care Plan Details Patient Name: Date of Service: Alexander Sweeney [place]. 10/19/2023 1:15 PM Medical Record Number: [id number] Patient Account Number: [id number] Date of Birth/Sex: Treating RN: [birth year] (47 y.o. [name] Primary Care Natarsha Hurwitz: Marcelino Duster Other Clinician: Referring Yaden Seith: Treating Oceane Fosse/Extender: Joylene Grapes in Treatment: 4 Active Inactive Electronic Signature(s) Signed: 10/20/2023 5:33:20 PM By: Midge Aver MSN RN CNS WTA Entered By: Midge Aver on 10/19/2023 14:10:13 -------------------------------------------------------------------------------- Non-Wound Condition Assessment Details Patient Name: Date of Service: Alexander Lopes MA S W. 10/19/2023 1:15 PM Medical Record Number: [id number] Patient Account Number: [id number] Date of Birth/Sex: Treating RN: Mar 04, 1976 (47 y.o. [name] Primary Care Rigby Swamy: Marcelino Duster Other Clinician: Referring Rifka Ramey: Treating Avie Checo/Extender: Joylene Grapes in Treatment: 4 Non-Wound Condition: Condition: Other Dermatologic Condition Location: Leg Side: Bilateral Photos Alexander Sweeney, Alexander Sweeney ([id number])  [id number] Page 6 of 7 Lymphedema Assessment Symptoms: (check all that apply) Dermatitis, Hyperpigmentation Electronic Signature(s) Signed: 10/20/2023 5:33:20 PM By: Midge Aver MSN RN CNS WTA Entered By: Midge Aver on 10/19/2023 13:41:03 -------------------------------------------------------------------------------- Pain Assessment Details Patient Name: Date of Service: Alexander Lopes MA S W. 10/19/2023 1:15 PM Medical Record Number: [id number] Patient Account Number: [id number] Date of Birth/Sex: Treating RN: 1976/07/14 (47 y.o. [name] Primary Care Cynia Abruzzo: Marcelino Duster Other Clinician: Referring Aveion Nguyen: Treating Aziya Arena/Extender: Joylene Grapes in Treatment: 4 Active Problems Location of Pain Severity and Description of Pain Patient Has Paino No Site Locations Pain Management and Medication Current Pain Management: Electronic Signature(s) Signed: 10/20/2023 5:33:20 PM By: Midge Aver MSN RN CNS WTA Entered By: Midge Aver on 10/19/2023 13:34:37 Alexander Sweeney ([id number]) [id number] Page 7 of 7 -------------------------------------------------------------------------------- Patient/Caregiver Education Details Patient Name: Date of Service: Alexander Sweeney [place]. 11/13/2024andnbsp1:15 PM Medical Record Number: [id number] Patient Account Number: [id number] Date of Birth/Gender: Treating RN: 1976-06-30 (47 y.o. [name] Primary Care Physician: Marcelino Duster Other Clinician: Referring Physician: Treating Physician/Extender: Joylene Grapes in Treatment: 4 Education Assessment Education Provided To: Patient Education Topics Provided Discharge Packet: Methods: Explain/Verbal Responses: State content correctly Electronic Signature(s) Signed: 10/20/2023 5:33:20 PM By: Midge Aver MSN RN CNS WTA Entered By: Midge Aver on 10/19/2023  14:10:26 -------------------------------------------------------------------------------- Vitals Details Patient Name: Date of Service: Alexander Lopes MA S W. 10/19/2023 1:15 PM Medical Record Number: [id number] Patient Account Number: [id number] Date of Birth/Sex: Treating RN: 01/07/1976 (47 y.o. [name] Primary Care Roddie Riegler: Marcelino Duster Other Clinician: Referring Hermine Feria: Treating Bianka Liberati/Extender: Joylene Grapes in Treatment: 4 Vital Signs Time Taken: 13:34 Temperature (F): 97.2 Height (in): 73 Pulse (bpm): 102 Weight (lbs): 230 Respiratory Rate (breaths/min): 18 Body Mass Index (BMI): 30.3 Blood Pressure (mmHg): 147/105 Reference Range: 80 - 120 mg / dl Electronic Signature(s) Signed: 10/20/2023 5:33:20 PM By: Midge Aver MSN RN CNS WTA Entered By: Midge Aver on 10/19/2023 13:34:28

## 2024-12-25 ENCOUNTER — Encounter: Admitting: Physician Assistant

## 2025-01-08 ENCOUNTER — Encounter: Admitting: Physician Assistant
